# Patient Record
Sex: Male | Born: 1970 | Race: White | Hispanic: No | Marital: Married | Smoking: Former smoker
Health system: Southern US, Community
[De-identification: ages and names within clinical notes are randomized; demographics above are authoritative.]

## PROBLEM LIST (undated history)

## (undated) ENCOUNTER — Ambulatory Visit: Admission: EM | Payer: BC Managed Care – PPO | Source: Home / Self Care

## (undated) DIAGNOSIS — E079 Disorder of thyroid, unspecified: Secondary | ICD-10-CM

## (undated) DIAGNOSIS — F99 Mental disorder, not otherwise specified: Secondary | ICD-10-CM

## (undated) DIAGNOSIS — I10 Essential (primary) hypertension: Secondary | ICD-10-CM

---

## 2005-10-02 ENCOUNTER — Emergency Department: Payer: Self-pay | Admitting: Emergency Medicine

## 2005-10-02 ENCOUNTER — Other Ambulatory Visit: Payer: Self-pay

## 2008-03-21 ENCOUNTER — Emergency Department: Payer: Self-pay | Admitting: Unknown Physician Specialty

## 2008-06-13 ENCOUNTER — Emergency Department: Payer: Self-pay | Admitting: Emergency Medicine

## 2010-08-25 ENCOUNTER — Emergency Department: Payer: Self-pay | Admitting: Emergency Medicine

## 2011-02-15 ENCOUNTER — Emergency Department: Payer: Self-pay | Admitting: Emergency Medicine

## 2011-08-17 ENCOUNTER — Emergency Department (HOSPITAL_COMMUNITY)
Admission: EM | Admit: 2011-08-17 | Discharge: 2011-08-17 | Disposition: A | Discharge status: Home / Self Care | Payer: Self-pay | Attending: Emergency Medicine | Admitting: Emergency Medicine

## 2011-08-17 ENCOUNTER — Encounter (HOSPITAL_COMMUNITY): Payer: Self-pay | Admitting: *Deleted

## 2011-08-17 ENCOUNTER — Emergency Department (HOSPITAL_COMMUNITY): Payer: Self-pay

## 2011-08-17 DIAGNOSIS — W2209XA Striking against other stationary object, initial encounter: Secondary | ICD-10-CM | POA: Insufficient documentation

## 2011-08-17 DIAGNOSIS — S0990XA Unspecified injury of head, initial encounter: Secondary | ICD-10-CM | POA: Insufficient documentation

## 2011-08-17 DIAGNOSIS — S0100XA Unspecified open wound of scalp, initial encounter: Secondary | ICD-10-CM | POA: Insufficient documentation

## 2011-08-17 DIAGNOSIS — I10 Essential (primary) hypertension: Secondary | ICD-10-CM | POA: Insufficient documentation

## 2011-08-17 DIAGNOSIS — S0101XA Laceration without foreign body of scalp, initial encounter: Secondary | ICD-10-CM

## 2011-08-17 DIAGNOSIS — IMO0002 Reserved for concepts with insufficient information to code with codable children: Secondary | ICD-10-CM | POA: Insufficient documentation

## 2011-08-17 HISTORY — DX: Essential (primary) hypertension: I10

## 2011-08-17 HISTORY — DX: Disorder of thyroid, unspecified: E07.9

## 2011-08-17 HISTORY — DX: Mental disorder, not otherwise specified: F99

## 2011-08-17 NOTE — Discharge Instructions (Signed)
Please read over the instructions below. You will either need to return to your primary care physician or to the [facility] in 7 days to have the staples removed. Return for any concerning symptoms otherwise follow up as discussed.  Head Injury, Adult You have had a head injury that does not appear serious at this time. A concussion is a state of changed mental ability, usually from a blow to the head. You should take clear liquids for the rest of the day and then resume your regular diet. You should not take sedatives or alcoholic beverages for as long as directed by your caregiver after discharge. After injuries such as yours, most problems occur within the first 24 hours. SYMPTOMS These minor symptoms may be experienced after discharge:  Memory difficulties.   Dizziness.   Headaches.   Double vision.   Hearing difficulties.   Depression.   Tiredness.   Weakness.   Difficulty with concentration.  If you experience any of these problems, you should not be alarmed. A concussion requires a few days for recovery. Many patients with head injuries frequently experience such symptoms. Usually, these problems disappear without medical care. If symptoms last for more than one day, notify your caregiver. See your caregiver sooner if symptoms are becoming worse rather than better. HOME CARE INSTRUCTIONS   During the next 24 hours you must stay with someone who can watch you for the warning signs listed below.  Although it is unlikely that serious side effects will occur, you should be aware of signs and symptoms which may necessitate your return to this location. Side effects may occur up to 7 - 10 days following the injury. It is important for you to carefully monitor your condition and contact your caregiver or seek immediate medical attention if there is a change in your condition. SEEK IMMEDIATE MEDICAL CARE IF:   There is confusion or drowsiness.   You can not awaken the  injured person.   There is nausea (feeling sick to your stomach) or continued, forceful vomiting.   You notice dizziness or unsteadiness which is getting worse, or inability to walk.   You have convulsions or unconsciousness.   You experience severe, persistent headaches not relieved by over-the-counter or prescription medicines for pain. (Do not take aspirin as this impairs clotting abilities). Take other pain medications only as directed.   You can not use arms or legs normally.   There is clear or bloody discharge from the nose or ears.  MAKE SURE YOU:   Understand these instructions.   Will watch your condition.   Will get help right away if you are not doing well or get worse.  Document Released: 05/26/2005 Document Revised: 05/15/2011 Document Reviewed: 04/13/2009 [facility] Patient Information 2012 [place], [place].Laceration Care, Adult A laceration is a cut or lesion that goes through all layers of the skin and into the tissue just beneath the skin. TREATMENT  Some lacerations may not require closure. Some lacerations may not be able to be closed due to an increased risk of infection. It is important to see your caregiver as soon as possible after an injury to minimize the risk of infection and maximize the opportunity for successful closure. If closure is appropriate, pain medicines may be given, if needed. The wound will be cleaned to help prevent infection. Your caregiver will use stitches (sutures), staples, wound glue (adhesive), or skin adhesive strips to repair the laceration. These tools bring the skin edges together to allow for  faster healing and a better cosmetic outcome. However, all wounds will heal with a scar. Once the wound has healed, scarring can be minimized by covering the wound with sunscreen during the day for 1 full year. HOME CARE INSTRUCTIONS  For sutures or staples:  Keep the wound clean and dry.   If you were given a bandage (dressing), you should  change it at least once a day. Also, change the dressing if it becomes wet or dirty, or as directed by your caregiver.   Wash the wound with soap and water 2 times a day. Rinse the wound off with water to remove all soap. Pat the wound dry with a clean towel.   After cleaning, apply a thin layer of the antibiotic ointment as recommended by your caregiver. This will help prevent infection and keep the dressing from sticking.   You may shower as usual after the first 24 hours. Do not soak the wound in water until the sutures are removed.   Only take over-the-counter or prescription medicines for pain, discomfort, or fever as directed by your caregiver.   Get your sutures or staples removed as directed by your caregiver.  For skin adhesive strips:  Keep the wound clean and dry.   Do not get the skin adhesive strips wet. You may bathe carefully, using caution to keep the wound dry.   If the wound gets wet, pat it dry with a clean towel.   Skin adhesive strips will fall off on their own. You may trim the strips as the wound heals. Do not remove skin adhesive strips that are still stuck to the wound. They will fall off in time.  For wound adhesive:  You may briefly wet your wound in the shower or bath. Do not soak or scrub the wound. Do not swim. Avoid periods of heavy perspiration until the skin adhesive has fallen off on its own. After showering or bathing, gently pat the wound dry with a clean towel.   Do not apply liquid medicine, cream medicine, or ointment medicine to your wound while the skin adhesive is in place. This may loosen the film before your wound is healed.   If a dressing is placed over the wound, be careful not to apply tape directly over the skin adhesive. This may cause the adhesive to be pulled off before the wound is healed.   Avoid prolonged exposure to sunlight or tanning lamps while the skin adhesive is in place. Exposure to ultraviolet light in the first year will  darken the scar.   The skin adhesive will usually remain in place for 5 to 10 days, then naturally fall off the skin. Do not pick at the adhesive film.  You may need a tetanus shot if:  You cannot remember when you had your last tetanus shot.   You have never had a tetanus shot.  If you get a tetanus shot, your arm may swell, get red, and feel warm to the touch. This is common and not a problem. If you need a tetanus shot and you choose not to have one, there is a rare chance of getting tetanus. Sickness from tetanus can be serious. SEEK MEDICAL CARE IF:   You have redness, swelling, or increasing pain in the wound.   You see a red line that goes away from the wound.   You have yellowish-white fluid (pus) coming from the wound.   You have a fever.   You notice a bad smell  coming from the wound or dressing.   Your wound breaks open before or after sutures have been removed.   You notice something coming out of the wound such as wood or glass.   Your wound is on your hand or foot and you cannot move a finger or toe.  SEEK IMMEDIATE MEDICAL CARE IF:   Your pain is not controlled with prescribed medicine.   You have severe swelling around the wound causing pain and numbness or a change in color in your arm, hand, leg, or foot.   Your wound splits open and starts bleeding.   You have worsening numbness, weakness, or loss of function of any joint around or beyond the wound.   You develop painful lumps near the wound or on the skin anywhere on your body.  MAKE SURE YOU:   Understand these instructions.   Will watch your condition.   Will get help right away if you are not doing well or get worse.  Document Released: 05/26/2005 Document Revised: 05/15/2011 Document Reviewed: 11/19/2010 [facility] Patient Information 2012 [place], [place].Stitches, Staples, or Skin Adhesive Strips  Stitches (sutures), staples, and skin adhesive strips hold the skin together as it heals. They will  usually be in place for 7 days or less. HOME CARE  Wash your hands with soap and water before and after you touch your wound.   Only take medicine as told by your doctor.   Cover your wound only if your doctor told you to. Otherwise, leave it open to air.   Do not get your stitches wet or dirty. If they get dirty, dab them gently with a clean washcloth. Wet the washcloth with soapy water. Do not rub. Pat them dry gently.   Do not put medicine or medicated cream on your stitches unless your doctor told you to.   Do not take out your own stitches or staples. Skin adhesive strips will fall off by themselves.   Do not pick at the wound. Picking can cause an infection.   Do not miss your follow-up appointment.   If you have problems or questions, call your doctor.  GET HELP RIGHT AWAY IF:   You have a temperature by mouth above 102 F (38.9 C), not controlled by medicine.   You have chills.   You have redness or pain around your stitches.   There is puffiness (swelling) around your stitches.   You notice fluid (drainage) from your stitches.   There is a bad smell coming from your wound.  MAKE SURE YOU:  Understand these instructions.   Will watch your condition.   Will get help if you are not doing well or get worse.  Document Released: 03/23/2009 Document Revised: 05/15/2011 Document Reviewed: 03/23/2009 [facility] Patient Information 2012 [place], [place].
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
## 2011-12-21 ENCOUNTER — Emergency Department: Payer: Self-pay | Admitting: Emergency Medicine

## 2014-07-02 ENCOUNTER — Emergency Department: Payer: Self-pay | Admitting: Internal Medicine

## 2016-06-23 ENCOUNTER — Encounter: Payer: Self-pay | Admitting: *Deleted

## 2016-06-23 ENCOUNTER — Emergency Department
Admission: EM | Admit: 2016-06-23 | Discharge: 2016-06-23 | Disposition: A | Discharge status: Home / Self Care | Payer: BLUE CROSS/BLUE SHIELD | Attending: Emergency Medicine | Admitting: Emergency Medicine

## 2016-06-23 DIAGNOSIS — Z87891 Personal history of nicotine dependence: Secondary | ICD-10-CM | POA: Insufficient documentation

## 2016-06-23 DIAGNOSIS — I10 Essential (primary) hypertension: Secondary | ICD-10-CM | POA: Diagnosis not present

## 2016-06-23 DIAGNOSIS — Z760 Encounter for issue of repeat prescription: Secondary | ICD-10-CM | POA: Diagnosis present

## 2016-06-23 DIAGNOSIS — E039 Hypothyroidism, unspecified: Secondary | ICD-10-CM

## 2016-06-23 LAB — COMPREHENSIVE METABOLIC PANEL
ALK PHOS: 35 U/L — AB (ref 38–126)
ALT: 21 U/L (ref 17–63)
ANION GAP: 6 (ref 5–15)
AST: 25 U/L (ref 15–41)
Albumin: 4.4 g/dL (ref 3.5–5.0)
BUN: 15 mg/dL (ref 6–20)
CALCIUM: 9 mg/dL (ref 8.9–10.3)
CHLORIDE: 106 mmol/L (ref 101–111)
CO2: 27 mmol/L (ref 22–32)
CREATININE: 1.27 mg/dL — AB (ref 0.61–1.24)
GFR calc Af Amer: >60 mL/min (ref >60)
Glucose, Bld: 87 mg/dL (ref 65–99)
Potassium: 4.1 mmol/L (ref 3.5–5.1)
Sodium: 139 mmol/L (ref 135–145)
Total Bilirubin: 0.6 mg/dL (ref 0.3–1.2)
Total Protein: 7.1 g/dL (ref 6.5–8.1)

## 2016-06-23 LAB — CBC
HCT: 42.8 % (ref 40.0–52.0)
Hemoglobin: 14.8 g/dL (ref 13.0–18.0)
MCH: 31.5 pg (ref 26.0–34.0)
MCHC: 34.5 g/dL (ref 32.0–36.0)
MCV: 91.3 fL (ref 80.0–100.0)
PLATELETS: 135 10*3/uL — AB (ref 150–440)
RBC: 4.68 MIL/uL (ref 4.40–5.90)
RDW: 14.5 % (ref 11.5–14.5)
WBC: 6.4 10*3/uL (ref 3.8–10.6)

## 2016-06-23 LAB — TSH: TSH: 274.809 u[IU]/mL — ABNORMAL HIGH (ref 0.350–4.500)

## 2016-06-23 MED ORDER — LEVOTHYROXINE SODIUM 175 MCG PO TABS: 175.0000 ug | ORAL_TABLET | Freq: Every day | ORAL | 0 refills | Status: DC

## 2016-06-23 MED ORDER — HYDROCHLOROTHIAZIDE 25 MG PO TABS: 25.0000 mg | ORAL_TABLET | Freq: Every day | ORAL | 0 refills | Status: DC

## 2016-06-23 NOTE — ED Notes (Signed)
AAOx3.  Skin warm and dry. NAD.  Ambulates with easy and steady gait.   

## 2016-06-23 NOTE — ED Triage Notes (Addendum)
States he has been out of his thyroid medication for 1 month and is unable to get to his PCP, states he was on BP meds at one point but is not on any currently

## 2016-06-23 NOTE — ED Notes (Signed)
See triage note   States he needs his thyroid meds refilled  Not able to see PCP

## 2016-06-23 NOTE — ED Provider Notes (Signed)
[facility] Emergency Department Provider Note  ____________________________________________  Time seen: Approximately 1:29 PM  I have reviewed the triage vital signs and the nursing notes.   HISTORY  Chief Complaint Medication Refill    HPI Chris Larsen is a 46 y.o. male that presents to the emergency department because he is out of levothyroxine. He states he last refilled his medicine in November. Patient states that he has taken 175 mcg of levothyroxine since he was 15. Patient also states that he was on blood pressure medication one year ago and was taken off the medication. Patient states he has continued to have high blood pressure since then.Patient does not remember which blood pressure medication he was on. Patient states that since being out of levothyroxine, he has had a headache, swelling, weight gain, sweating, nausea, diarrhea, constipation. Patient has not been able to get to his PCP, as he does not like driving to [facility]. Patient would like to find a PCP in the area.   Past Medical History:  Diagnosis Date  . Chronic mental illness    Unable to specifiy  . Hypertension   . Thyroid disease     There are no active problems to display for this patient.   History reviewed. No pertinent surgical history.  Prior to Admission medications   Medication Sig Start Date End Date Taking? Authorizing Provider  hydrochlorothiazide (HYDRODIURIL) 25 MG tablet Take 1 tablet (25 mg total) by mouth daily. 06/23/16   Enid Derry, PA-C  levothyroxine (SYNTHROID, LEVOTHROID) 175 MCG tablet Take 1 tablet (175 mcg total) by mouth daily. 06/23/16   Enid Derry, PA-C    Allergies Patient has no known allergies.  History reviewed. No pertinent family history.  Social History Social History  Substance Use Topics  . Smoking status: Former Smoker    Quit date: 06/19/2011  . Smokeless tobacco: Not on file  . Alcohol use No     Review of Systems   Constitutional: No fever/chills Cardiovascular: No chest pain. Respiratory: No cough. Gastrointestinal: No vomiting.  Skin: Negative for rash, abrasions, lacerations, ecchymosis. Neurological: Negative for numbness or tingling   ____________________________________________   PHYSICAL EXAM:  VITAL SIGNS: ED Triage Vitals [06/23/16 1145]  Enc Vitals Group     BP (!) 162/109     Pulse Rate 84     Resp 20     Temp 98.6 F (37 C)     Temp Source Oral     SpO2 99 %     Weight 180 lb 9.6 oz (81.9 kg)     Height 5\' 5"  (1.651 m)     Head Circumference      Peak Flow      Pain Score 8     Pain Loc      Pain Edu?      Excl. in GC?      Constitutional: Alert and oriented. Well appearing and in no acute distress. Eyes: Conjunctivae are normal. PERRL. EOMI. Head: Atraumatic. ENT:      Ears:      Nose: No congestion/rhinnorhea.      Mouth/Throat: Mucous membranes are moist.  Neck: No stridor.   Cardiovascular: Normal rate, regular rhythm. Normal S1 and S2.  Good peripheral circulation. Respiratory: Normal respiratory effort without tachypnea or retractions. Lungs CTAB. Good air entry to the bases with no decreased or absent breath sounds. Gastrointestinal: Bowel sounds 4 quadrants. Soft and nontender to palpation. No guarding or rigidity. No palpable masses. No distention.  Musculoskeletal: Full range of motion to all extremities. No gross deformities appreciated. Neurologic:  Normal speech and language. No gross focal neurologic deficits are appreciated.  Skin:  Skin is warm, dry and intact. No rash noted. Psychiatric: Mood and affect are normal. Speech and behavior are normal. Patient exhibits appropriate insight and judgement.   ____________________________________________   LABS (all labs ordered are listed, but only abnormal results are displayed)  Labs Reviewed  CBC - Abnormal; Notable for the following:       Result Value   Platelets 135 (*)    All other  components within normal limits  COMPREHENSIVE METABOLIC PANEL - Abnormal; Notable for the following:    Creatinine, Ser 1.27 (*)    Alkaline Phosphatase 35 (*)    All other components within normal limits  TSH - Abnormal; Notable for the following:    TSH 274.809 (*)    All other components within normal limits   ____________________________________________  EKG   ____________________________________________  RADIOLOGY   No results found.  ____________________________________________    PROCEDURES  Procedure(s) performed:    Procedures    Medications - No data to display   ____________________________________________   INITIAL IMPRESSION / ASSESSMENT AND PLAN / ED COURSE  Pertinent labs & imaging results that were available during my care of the patient were reviewed by me and considered in my medical decision making (see chart for details).  Review of the [place] CSRS was performed in accordance of the [facility] prior to dispensing any controlled drugs.  Clinical Course as of Jun 23 1526  Mon Jun 23, 2016  1350 TSH: Marland Kitchen(!) 274.809 [AW]    Clinical Course User Index [AW] Enid DerryAshley Mykelti Goldenstein, PA-C    Patient's diagnosis is consistent with hypothyroidism and hypertension. Patient is symptomatic so a short course of levothyroxine and hydrochlorothiazide will be given. Patient was instructed [facility] [place] [facility] [facility] [place]. Vital signs and exam are reassuring. Patient will be discharged home with prescriptions for levothyroxine and hydrochlorothiazide. Patient is given ED precautions to return to the ED for any worsening or new symptoms.     ____________________________________________  FINAL CLINICAL IMPRESSION(S) / ED DIAGNOSES  Final diagnoses:  Medication refill  Hypertension, unspecified type  Hypothyroidism, unspecified type      NEW MEDICATIONS STARTED DURING THIS VISIT:  Discharge Medication List as of  06/23/2016  2:08 PM    START taking these medications   Details  hydrochlorothiazide (HYDRODIURIL) 25 MG tablet Take 1 tablet (25 mg total) by mouth daily., Starting Mon 06/23/2016, Print            This chart was dictated using voice recognition software/Dragon. Despite best efforts to proofread, errors can occur which can change the meaning. Any change was purely unintentional.    Enid DerryAshley Chrisean Kloth, PA-C 06/23/16 1529    Governor Rooksebecca Lord, MD 06/23/16 [phone]

## 2017-11-25 ENCOUNTER — Ambulatory Visit
Admission: RE | Admit: 2017-11-25 | Discharge: 2017-11-25 | Disposition: A | Discharge status: Home / Self Care | Payer: Worker's Compensation | Source: Ambulatory Visit | Attending: Physician Assistant | Admitting: Physician Assistant

## 2017-11-25 ENCOUNTER — Other Ambulatory Visit: Payer: Self-pay | Admitting: Physician Assistant

## 2017-11-25 DIAGNOSIS — M25512 Pain in left shoulder: Secondary | ICD-10-CM | POA: Diagnosis present

## 2017-12-04 ENCOUNTER — Emergency Department
Admission: EM | Admit: 2017-12-04 | Discharge: 2017-12-04 | Disposition: A | Discharge status: Home / Self Care | Payer: BLUE CROSS/BLUE SHIELD | Attending: Emergency Medicine | Admitting: Emergency Medicine

## 2017-12-04 ENCOUNTER — Other Ambulatory Visit: Payer: Self-pay

## 2017-12-04 ENCOUNTER — Emergency Department
Admission: EM | Admit: 2017-12-04 | Discharge: 2017-12-04 | Disposition: A | Discharge status: Home / Self Care | Payer: BLUE CROSS/BLUE SHIELD | Source: Home / Self Care | Attending: Emergency Medicine | Admitting: Emergency Medicine

## 2017-12-04 ENCOUNTER — Encounter: Payer: Self-pay | Admitting: *Deleted

## 2017-12-04 ENCOUNTER — Encounter: Payer: Self-pay | Admitting: Emergency Medicine

## 2017-12-04 DIAGNOSIS — E039 Hypothyroidism, unspecified: Secondary | ICD-10-CM | POA: Insufficient documentation

## 2017-12-04 DIAGNOSIS — Z760 Encounter for issue of repeat prescription: Secondary | ICD-10-CM

## 2017-12-04 DIAGNOSIS — Z87891 Personal history of nicotine dependence: Secondary | ICD-10-CM | POA: Insufficient documentation

## 2017-12-04 DIAGNOSIS — I10 Essential (primary) hypertension: Secondary | ICD-10-CM

## 2017-12-04 LAB — CBC WITH DIFFERENTIAL/PLATELET
BASOS ABS: 0.1 10*3/uL (ref 0–0.1)
Basophils Relative: 1 %
EOS ABS: 0.2 10*3/uL (ref 0–0.7)
Eosinophils Relative: 3 %
HEMATOCRIT: 44.6 % (ref 40.0–52.0)
Hemoglobin: 15.6 g/dL (ref 13.0–18.0)
Lymphocytes Relative: 28 %
Lymphs Abs: 2.5 10*3/uL (ref 1.0–3.6)
MCH: 31.6 pg (ref 26.0–34.0)
MCHC: 35 g/dL (ref 32.0–36.0)
MCV: 90.5 fL (ref 80.0–100.0)
MONO ABS: 0.5 10*3/uL (ref 0.2–1.0)
Monocytes Relative: 5 %
NEUTROS ABS: 5.8 10*3/uL (ref 1.4–6.5)
Neutrophils Relative %: 63 %
PLATELETS: 184 10*3/uL (ref 150–440)
RBC: 4.93 MIL/uL (ref 4.40–5.90)
RDW: 13.9 % (ref 11.5–14.5)
WBC: 9.1 10*3/uL (ref 3.8–10.6)

## 2017-12-04 LAB — COMPREHENSIVE METABOLIC PANEL
ALBUMIN: 4.1 g/dL (ref 3.5–5.0)
ALT: 32 U/L (ref 0–44)
AST: 26 U/L (ref 15–41)
Alkaline Phosphatase: 40 U/L (ref 38–126)
Anion gap: 9 (ref 5–15)
BUN: 20 mg/dL (ref 6–20)
CHLORIDE: 103 mmol/L (ref 98–111)
CO2: 24 mmol/L (ref 22–32)
CREATININE: 1.35 mg/dL — AB (ref 0.61–1.24)
Calcium: 8.9 mg/dL (ref 8.9–10.3)
GFR calc Af Amer: >60 mL/min (ref >60)
GLUCOSE: 117 mg/dL — AB (ref 70–99)
Potassium: 3.6 mmol/L (ref 3.5–5.1)
Sodium: 136 mmol/L (ref 135–145)
Total Bilirubin: 0.4 mg/dL (ref 0.3–1.2)
Total Protein: 6.8 g/dL (ref 6.5–8.1)

## 2017-12-04 LAB — TSH: TSH: 374.399 u[IU]/mL — AB (ref 0.350–4.500)

## 2017-12-04 MED ORDER — HYDROCHLOROTHIAZIDE 25 MG PO TABS: 25.0000 mg | ORAL_TABLET | Freq: Every day | ORAL | 0 refills | Status: AC

## 2017-12-04 MED ORDER — KETOROLAC TROMETHAMINE 30 MG/ML IJ SOLN: 30.0000 mg | Freq: Once | INTRAMUSCULAR | Status: DC

## 2017-12-04 MED ORDER — LEVOTHYROXINE SODIUM 175 MCG PO TABS: 175.0000 ug | ORAL_TABLET | Freq: Every day | ORAL | 0 refills | Status: AC

## 2017-12-04 NOTE — ED Provider Notes (Signed)
[facility] Emergency Department Provider Note   ____________________________________________   First MD Initiated Contact with Patient 12/04/17 1412     (approximate)  I have reviewed the triage vital signs and the nursing notes.   HISTORY  Chief Complaint Medication Refill    HPI Chris Larsen is a 47 y.o. male patient requests refill of medication for hypothyroidism and hypertension.  Patient state he was seen by family doctor 8 months ago and given 29-month supply of levothyroxine.  Patient state he has been out of medication for 1 1/2 to 2 months.  Patient stated he requesting enough medication to see another family practitioner.   Past Medical History:  Diagnosis Date  . Chronic mental illness    Unable to specifiy  . Hypertension   . Thyroid disease     There are no active problems to display for this patient.   History reviewed. No pertinent surgical history.  Prior to Admission medications   Medication Sig Start Date End Date Taking? Authorizing Provider  hydrochlorothiazide (HYDRODIURIL) 25 MG tablet Take 1 tablet (25 mg total) by mouth daily. 12/04/17   Joni Reining, PA-C  levothyroxine (SYNTHROID, LEVOTHROID) 175 MCG tablet Take 1 tablet (175 mcg total) by mouth daily. 12/04/17   Joni Reining, PA-C    Allergies Patient has no known allergies.  No family history on file.  Social History Social History   Tobacco Use  . Smoking status: Former Smoker    Last attempt to quit: 06/19/2011    Years since quitting: 6.4  . Smokeless tobacco: Never Used  Substance Use Topics  . Alcohol use: No  . Drug use: No    Review of Systems Constitutional: No fever/chills Eyes: No visual changes. ENT: No sore throat. Cardiovascular: Denies chest pain. Respiratory: Denies shortness of breath. Gastrointestinal: No abdominal pain.  No nausea, no vomiting.  No diarrhea.  No constipation. Genitourinary: Negative for  dysuria. Musculoskeletal: Negative for back pain. Skin: Negative for rash. Neurological: Negative for headaches, focal weakness or numbness. Endocrine:Hypertension hypothyroidism.  ____________________________________________   PHYSICAL EXAM:  VITAL SIGNS: ED Triage Vitals  Enc Vitals Group     BP 12/04/17 1409 (!) 147/103     Pulse Rate 12/04/17 1409 88     Resp 12/04/17 1409 16     Temp 12/04/17 1409 98.8 F (37.1 C)     Temp Source 12/04/17 1409 Oral     SpO2 12/04/17 1409 96 %     Weight 12/04/17 1410 170 lb (77.1 kg)     Height 12/04/17 1410 5\' 6"  (1.676 m)     Head Circumference --      Peak Flow --      Pain Score 12/04/17 1413 6     Pain Loc --      Pain Edu? --      Excl. in GC? --    Constitutional: Alert and oriented. Well appearing and in no acute distress. Cardiovascular: Normal rate, regular rhythm. Grossly normal heart sounds.  Good peripheral circulation. Respiratory: Normal respiratory effort.  No retractions. Lungs CTAB. Musculoskeletal: No lower extremity tenderness nor edema.  No joint effusions. Neurologic:  Normal speech and language. No gross focal neurologic deficits are appreciated. No gait instability. Skin:  Skin is warm, dry and intact. No rash noted. Psychiatric: Mood and affect are normal. Speech and behavior are normal.  ____________________________________________   LABS (all labs ordered are listed, but only abnormal results are displayed)  Labs Reviewed -  No data to display ____________________________________________  EKG   ____________________________________________  RADIOLOGY  ED MD interpretation:    Official radiology report(s): No results found.  ____________________________________________   PROCEDURES  Procedure(s) performed: None  Procedures  Critical Care performed: No  ____________________________________________   INITIAL IMPRESSION / ASSESSMENT AND PLAN / ED COURSE  As part of my medical decision  making, I reviewed the following data within the electronic MEDICAL RECORD NUMBER    Patient presents for refill of medication for hypertension hypothyroidism.  Patient stable to establish PCP within the next month.  Discussed lab results with patient.  Patient given prescription for levothyroxine and HCTZ.      ____________________________________________   FINAL CLINICAL IMPRESSION(S) / ED DIAGNOSES  Final diagnoses:  None     ED Discharge Orders    None       Note:  This document was prepared using Dragon voice recognition software and may include unintentional dictation errors.    Joni ReiningSmith, Ronald K, PA-C 12/04/17 1551    Sharyn CreamerQuale, Mark, MD 12/04/17 1704    Joni ReiningSmith, Ronald K, PA-C 12/04/17 [id number] [id number]    Quale, Mark, MD 12/06/17 1459

## 2017-12-04 NOTE — Discharge Instructions (Addendum)
Advised to establish care with PCP for refills of medications.

## 2017-12-04 NOTE — ED Notes (Signed)
Pt requesting medication refill. Hypertension is reported to be due to the pain in his arm. Pt in NAD at this time.

## 2017-12-04 NOTE — ED Triage Notes (Addendum)
Pt arrives via POV with requests for levothyroxine 175mcg refill. Pt reports taking the medication since he was 47 years old. Got a 6 month supply when he went to his doctor approx 8 months ago. Has been out 1.5-2 months. Requesting "enough to get by" until he can get to his doctor. States he cannot get appt "for at least a month."  BP elevated d/t pain in left shoulder from work incident. Hx of hypertension. Not here for shoulder. Has appt with orthopedic today.

## 2017-12-04 NOTE — ED Notes (Signed)
Pt left room AMA right as Provider reported treatment plan. Pt did not stop for signature of leaving AMA and did not stop for registration. Pt ambulatory at time of discharge and in NAD.

## 2017-12-04 NOTE — ED Provider Notes (Signed)
[facility] Emergency Department Provider Note   ____________________________________________   First MD Initiated Contact with Patient 12/04/17 1412     (approximate)  I have reviewed the triage vital signs and the nursing notes.   HISTORY  Chief Complaint Medication Refill    HPI Chris Larsen is a 47 y.o. male patient requests refill of medication for hypothyroidism and hypertension.  Patient state he was seen by family doctor 8 months ago and given 6767-month supply of levothyroxine.  Patient state he has been out of medication for 1 1/2 to 2 months.  Patient stated he requesting enough medication to see another family practitioner.   Past Medical History:  Diagnosis Date  . Chronic mental illness    Unable to specifiy  . Hypertension   . Thyroid disease     There are no active problems to display for this patient.   History reviewed. No pertinent surgical history.  Prior to Admission medications   Medication Sig Start Date End Date Taking? Authorizing Provider  hydrochlorothiazide (HYDRODIURIL) 25 MG tablet Take 1 tablet (25 mg total) by mouth daily. 12/04/17   Joni ReiningSmith, Peggyann Zwiefelhofer K, PA-C  levothyroxine (SYNTHROID, LEVOTHROID) 175 MCG tablet Take 1 tablet (175 mcg total) by mouth daily. 12/04/17   Joni ReiningSmith, Teneil Shiller K, PA-C    Allergies Patient has no known allergies.  History reviewed. No pertinent family history.  Social History Social History   Tobacco Use  . Smoking status: Former Smoker    Last attempt to quit: 06/19/2011    Years since quitting: 6.4  . Smokeless tobacco: Never Used  Substance Use Topics  . Alcohol use: No  . Drug use: No    Review of Systems Constitutional: No fever/chills Eyes: No visual changes. ENT: No sore throat. Cardiovascular: Denies chest pain. Respiratory: Denies shortness of breath. Gastrointestinal: No abdominal pain.  No nausea, no vomiting.  No diarrhea.  No constipation. Genitourinary: Negative for  dysuria. Musculoskeletal: Negative for back pain. Skin: Negative for rash. Neurological: Negative for headaches, focal weakness or numbness. Endocrine:Hypertension hypothyroidism.  ____________________________________________   PHYSICAL EXAM:  VITAL SIGNS: ED Triage Vitals  Enc Vitals Group     BP 12/04/17 1409 (!) 147/103     Pulse Rate 12/04/17 1409 88     Resp 12/04/17 1409 16     Temp 12/04/17 1409 98.8 F (37.1 C)     Temp Source 12/04/17 1409 Oral     SpO2 12/04/17 1409 96 %     Weight 12/04/17 1410 170 lb (77.1 kg)     Height 12/04/17 1410 5\' 6"  (1.676 m)     Head Circumference --      Peak Flow --      Pain Score 12/04/17 1413 6     Pain Loc --      Pain Edu? --      Excl. in GC? --    Constitutional: Alert and oriented. Well appearing and in no acute distress. Cardiovascular: Normal rate, regular rhythm. Grossly normal heart sounds.  Good peripheral circulation. Respiratory: Normal respiratory effort.  No retractions. Lungs CTAB. Musculoskeletal: No lower extremity tenderness nor edema.  No joint effusions. Neurologic:  Normal speech and language. No gross focal neurologic deficits are appreciated. No gait instability. Skin:  Skin is warm, dry and intact. No rash noted. Psychiatric: Mood and affect are normal. Speech and behavior are normal.  ____________________________________________   LABS (all labs ordered are listed, but only abnormal results are displayed)  Labs Reviewed  COMPREHENSIVE METABOLIC PANEL - Abnormal; Notable for the following components:      Result Value   Glucose, Bld 117 (*)    Creatinine, Ser 1.35 (*)    All other components within normal limits  TSH - Abnormal; Notable for the following components:   TSH 374.399 (*)    All other components within normal limits  CBC WITH DIFFERENTIAL/PLATELET   ____________________________________________  EKG   ____________________________________________  RADIOLOGY  ED MD interpretation:     Official radiology report(s): No results found.  ____________________________________________   PROCEDURES  Procedure(s) performed: None  Procedures  Critical Care performed: No  ____________________________________________   INITIAL IMPRESSION / ASSESSMENT AND PLAN / ED COURSE  As part of my medical decision making, I reviewed the following data within the electronic MEDICAL RECORD NUMBER    Patient presents for refill of medication for hypertension hypothyroidism.  Patient stable to establish PCP within the next month.  Discussed lab results with patient.  Patient given prescription for levothyroxine and HCTZ.      ____________________________________________   FINAL CLINICAL IMPRESSION(S) / ED DIAGNOSES  Final diagnoses:  Medication refill     ED Discharge Orders        Ordered    hydrochlorothiazide (HYDRODIURIL) 25 MG tablet  Daily     12/04/17 1546    levothyroxine (SYNTHROID, LEVOTHROID) 175 MCG tablet  Daily     12/04/17 1546       Note:  This document was prepared using Dragon voice recognition software and may include unintentional dictation errors.    Joni Reining, PA-C 12/04/17 1551    Sharyn Creamer, MD 12/04/17 [phone]

## 2017-12-04 NOTE — ED Triage Notes (Signed)
Pt arrives via POV with requests for levothyroxine 175mcg refill. Pt reports taking the medication since he was 47 years old. Got a 6 month supply when he went to his doctor approx 8 months ago. Has been out 1.5-2 months. Requesting "enough to get by" until he can get to his doctor. States he cannot get appt "for at least a month."

## 2017-12-18 ENCOUNTER — Other Ambulatory Visit: Payer: Self-pay

## 2017-12-18 ENCOUNTER — Emergency Department
Admission: EM | Admit: 2017-12-18 | Discharge: 2017-12-18 | Disposition: A | Discharge status: Home / Self Care | Payer: BLUE CROSS/BLUE SHIELD | Attending: Emergency Medicine | Admitting: Emergency Medicine

## 2017-12-18 ENCOUNTER — Encounter: Payer: Self-pay | Admitting: Emergency Medicine

## 2017-12-18 DIAGNOSIS — I10 Essential (primary) hypertension: Secondary | ICD-10-CM | POA: Insufficient documentation

## 2017-12-18 DIAGNOSIS — M25512 Pain in left shoulder: Secondary | ICD-10-CM | POA: Insufficient documentation

## 2017-12-18 DIAGNOSIS — Z87891 Personal history of nicotine dependence: Secondary | ICD-10-CM | POA: Diagnosis not present

## 2017-12-18 DIAGNOSIS — Z79899 Other long term (current) drug therapy: Secondary | ICD-10-CM | POA: Diagnosis not present

## 2017-12-18 MED ORDER — CLONIDINE HCL 0.1 MG PO TABS
0.1000 mg | ORAL_TABLET | Freq: Once | ORAL | Status: AC
  Administered 2017-12-18: 0.1 mg via ORAL
  Filled 2017-12-18: qty 1

## 2017-12-18 MED ORDER — KETOROLAC TROMETHAMINE 30 MG/ML IJ SOLN
30.0000 mg | Freq: Once | INTRAMUSCULAR | Status: AC
  Administered 2017-12-18: 30 mg via INTRAMUSCULAR
  Filled 2017-12-18: qty 1

## 2017-12-18 MED ORDER — HYDROCODONE-ACETAMINOPHEN 5-325 MG PO TABS: 1.0000 | ORAL_TABLET | ORAL | 0 refills | Status: DC | PRN

## 2017-12-18 MED ORDER — DIAZEPAM 5 MG PO TABS
5.0000 mg | ORAL_TABLET | Freq: Once | ORAL | Status: AC
  Administered 2017-12-18: 5 mg via ORAL
  Filled 2017-12-18: qty 1

## 2017-12-18 NOTE — ED Triage Notes (Signed)
C/O pain to left shoulder.  States heard shoulder "pop" on June 17th, and pain persists.  AAOx3.  Skin warm and dry. NAD

## 2017-12-18 NOTE — ED Provider Notes (Addendum)
[facility] Emergency Department Provider Note   ____________________________________________    I have reviewed the triage vital signs and the nursing notes.   HISTORY  Chief Complaint Shoulder Injury     HPI Chris Larsen is a 47 y.o. male who presents with complaints of left-sided shoulder pain.  Patient has had this pain for nearly a month.  He states the pain is primarily in his left superior shoulder but radiates into his left neck and into his left arm as well.  Seen in urgent care several weeks ago for this with normal x-ray, seen by orthopedics with an MRI suspicious for labrum tear.  Orthopedist feels that pain is out of proportion to exam and has referred for eval of complex regional pain syndrome.  Patient is quite frustrated by this.  Patient reports he did not take his blood pressure medication this morning   Past Medical History:  Diagnosis Date  . Chronic mental illness    Unable to specifiy  . Hypertension   . Thyroid disease     There are no active problems to display for this patient.   History reviewed. No pertinent surgical history.  Prior to Admission medications   Medication Sig Start Date End Date Taking? Authorizing Provider  hydrochlorothiazide (HYDRODIURIL) 25 MG tablet Take 1 tablet (25 mg total) by mouth daily. 12/04/17   Joni Reining, PA-C  HYDROcodone-acetaminophen (NORCO/VICODIN) 5-325 MG tablet Take 1 tablet by mouth every 4 (four) hours as needed for moderate pain. 12/18/17   Jene Every, MD  levothyroxine (SYNTHROID, LEVOTHROID) 175 MCG tablet Take 1 tablet (175 mcg total) by mouth daily. 12/04/17   Joni Reining, PA-C     Allergies Patient has no known allergies.  No family history on file.  Social History Social History   Tobacco Use  . Smoking status: Former Smoker    Last attempt to quit: 06/19/2011    Years since quitting: 6.5  . Smokeless tobacco: Never Used  Substance Use Topics  . Alcohol  use: No  . Drug use: No    Review of Systems  Constitutional: No fever/chills Eyes: No visual changes.   Cardiovascular: Denies chest pain. Respiratory: Denies shortness of breath.  Musculoskeletal: Negative for back pain.  Left shoulder pain as above, severe Skin: Negative for rash. Neurological: Negative for  weakness   ____________________________________________   PHYSICAL EXAM:  VITAL SIGNS: ED Triage Vitals  Enc Vitals Group     BP 12/18/17 0816 (!) 183/129     Pulse Rate 12/18/17 0814 86     Resp 12/18/17 0814 16     Temp 12/18/17 0814 98.6 F (37 C)     Temp Source 12/18/17 0814 Oral     SpO2 12/18/17 0814 97 %     Weight 12/18/17 0813 77.1 kg (170 lb)     Height 12/18/17 0813 1.676 m (5\' 6" )     Head Circumference --      Peak Flow --      Pain Score 12/18/17 0812 8     Pain Loc --      Pain Edu? --      Excl. in GC? --     Constitutional: Alert and oriented. No acute distress.  Anxious  Head: Atraumatic.  Mouth/Throat: Mucous membranes are moist.  Normal exam Neck:  Painless ROM, no vertebral tenderness to palpation Cardiovascular: Normal rate, regular rhythm.   Good peripheral circulation. Respiratory: Normal respiratory effort.  No retractions.  Musculoskeletal: Patient's primary site of tenderness is at the left [facility] joint he is able to range his arm without significant difficulty but I agree his pain does seem to be quite severe and out of proportion with imaging results.  2+ distal pulses Neurologic:  Normal speech and language. No gross focal neurologic deficits are appreciated.  Skin:  Skin is warm, dry and intact. No rash noted. Psychiatric: Mood and affect are normal. Speech and behavior are normal.  ____________________________________________   LABS (all labs ordered are listed, but only abnormal results are displayed)  Labs Reviewed - No data to  display ____________________________________________  EKG  None ____________________________________________  RADIOLOGY  None ____________________________________________   PROCEDURES  Procedure(s) performed: No  Procedures   Critical Care performed: No ____________________________________________   INITIAL IMPRESSION / ASSESSMENT AND PLAN / ED COURSE  Pertinent labs & imaging results that were available during my care of the patient were reviewed by me and considered in my medical decision making (see chart for details).  Patient presents with shoulder pain as above, has already seen orthopedist for this, is upset with his visit there would like a second opinion.  I will refer him to a different orthopedic office.  Treated with IM Toradol, 5 mill grams p.o. Valium, clonidine for blood pressure, patient did not take his blood pressure medications today for his chronic hypertension, recommend PCP follow-up for blood pressure recheck 1 week      ____________________________________________   FINAL CLINICAL IMPRESSION(S) / ED DIAGNOSES  Final diagnoses:  Acute pain of left shoulder        Note:  This document was prepared using Dragon voice recognition software and may include unintentional dictation errors.    Jene EveryKinner, Sallee Hogrefe, MD 12/18/17 1034    Jene EveryKinner, Treniece Holsclaw, MD 12/18/17 1034

## 2017-12-18 NOTE — ED Notes (Signed)
Pt c/o pain left neck/shoulder/arm - the pain started June 17th - pt reports that the left shoulder "popped" when he was moving "something" and since then the pain has been present and getting worse

## 2020-06-19 ENCOUNTER — Emergency Department
Admission: EM | Admit: 2020-06-19 | Discharge: 2020-06-19 | Disposition: A | Discharge status: Home / Self Care | Payer: BC Managed Care – PPO | Attending: Emergency Medicine | Admitting: Emergency Medicine

## 2020-06-19 ENCOUNTER — Encounter: Payer: Self-pay | Admitting: Emergency Medicine

## 2020-06-19 ENCOUNTER — Other Ambulatory Visit: Payer: Self-pay

## 2020-06-19 ENCOUNTER — Emergency Department: Payer: BC Managed Care – PPO

## 2020-06-19 DIAGNOSIS — Y9289 Other specified places as the place of occurrence of the external cause: Secondary | ICD-10-CM | POA: Insufficient documentation

## 2020-06-19 DIAGNOSIS — Z87891 Personal history of nicotine dependence: Secondary | ICD-10-CM | POA: Insufficient documentation

## 2020-06-19 DIAGNOSIS — S4991XA Unspecified injury of right shoulder and upper arm, initial encounter: Secondary | ICD-10-CM | POA: Diagnosis present

## 2020-06-19 DIAGNOSIS — I10 Essential (primary) hypertension: Secondary | ICD-10-CM | POA: Insufficient documentation

## 2020-06-19 DIAGNOSIS — W228XXA Striking against or struck by other objects, initial encounter: Secondary | ICD-10-CM | POA: Diagnosis not present

## 2020-06-19 DIAGNOSIS — Z79899 Other long term (current) drug therapy: Secondary | ICD-10-CM | POA: Insufficient documentation

## 2020-06-19 MED ORDER — OXYCODONE HCL 5 MG PO TABS
5.0000 mg | ORAL_TABLET | Freq: Once | ORAL | Status: AC
  Administered 2020-06-19: 5 mg via ORAL
  Filled 2020-06-19: qty 1

## 2020-06-19 MED ORDER — OXYCODONE HCL 5 MG PO TABS: 5.0000 mg | ORAL_TABLET | Freq: Three times a day (TID) | ORAL | 0 refills | Status: AC | PRN

## 2020-06-19 NOTE — ED Notes (Signed)
Wife at bedside.

## 2020-06-19 NOTE — Discharge Instructions (Addendum)
IMPRESSION: No acute fracture or dislocation of the right shoulder.  X-ray was reassuring.  Take Tylenol and ibuprofen help with your pain.  Take oxycodone for breakthrough pain.  Do not drive or work on this.  Return to the ER for worsening symptoms.  Follow-up with orthopedics if your pain is not resolving in 2 to 3 weeks

## 2020-06-19 NOTE — ED Provider Notes (Signed)
[facility] Emergency Department Provider Note  ____________________________________________   Event Date/Time   First MD Initiated Contact with Patient 06/19/20 2149     (approximate)  I have reviewed the triage vital signs and the nursing notes.   HISTORY  Chief Complaint Shoulder Injury    HPI Chris Larsen is a 50 y.o. male with hypertension who comes in for injury.  Patient states that he was at work when he pipe hit his right shoulder.  He does not want to file Workmen's Comp.  He is having moderate to severe pain constant, worse with moving, nothing makes it better, including ibuprofen and Tylenol.  He denies hitting his head or his chest or any other injuries.          Past Medical History:  Diagnosis Date  . Chronic mental illness    Unable to specifiy  . Hypertension   . Thyroid disease     There are no problems to display for this patient.   History reviewed. No pertinent surgical history.  Prior to Admission medications   Medication Sig Start Date End Date Taking? Authorizing Provider  hydrochlorothiazide (HYDRODIURIL) 25 MG tablet Take 1 tablet (25 mg total) by mouth daily. 12/04/17   Joni Reining, PA-C  HYDROcodone-acetaminophen (NORCO/VICODIN) 5-325 MG tablet Take 1 tablet by mouth every 4 (four) hours as needed for moderate pain. 12/18/17   Jene Every, MD  levothyroxine (SYNTHROID, LEVOTHROID) 175 MCG tablet Take 1 tablet (175 mcg total) by mouth daily. 12/04/17   Joni Reining, PA-C    Allergies Patient has no known allergies.  No family history on file.  Social History Social History   Tobacco Use  . Smoking status: Former Smoker    Quit date: 06/19/2011    Years since quitting: 9.0  . Smokeless tobacco: Never Used  Substance Use Topics  . Alcohol use: No  . Drug use: No      Review of Systems Constitutional: No fever/chills Eyes: No visual changes. ENT: No sore throat. Cardiovascular: Denies chest  pain. Respiratory: Denies shortness of breath. Gastrointestinal: No abdominal pain.  No nausea, no vomiting.  No diarrhea.  No constipation. Genitourinary: Negative for dysuria. Musculoskeletal: Negative for back pain.  Right shoulder injury Skin: Negative for rash. Neurological: Negative for headaches, focal weakness or numbness. All other ROS negative ____________________________________________   PHYSICAL EXAM:  VITAL SIGNS: ED Triage Vitals  Enc Vitals Group     BP 06/19/20 1923 133/78     Pulse Rate 06/19/20 1923 100     Resp 06/19/20 1923 20     Temp 06/19/20 1923 99.8 F (37.7 C)     Temp Source 06/19/20 1923 Oral     SpO2 06/19/20 1923 97 %     Weight 06/19/20 1924 170 lb (77.1 kg)     Height 06/19/20 1924 5\' 6"  (1.676 m)     Head Circumference --      Peak Flow --      Pain Score 06/19/20 1924 6     Pain Loc --      Pain Edu? --      Excl. in GC? --     Constitutional: Alert and oriented. Well appearing and in no acute distress. Eyes: Conjunctivae are normal. EOMI. Head: Atraumatic. Nose: No congestion/rhinnorhea. Mouth/Throat: Mucous membranes are moist.   Neck: No stridor. Trachea Midline. FROM Cardiovascular: Normal rate, regular rhythm. Grossly normal heart sounds.  Good peripheral circulation.  No chest wall tenderness  Respiratory: Normal respiratory effort.  No retractions. Lungs CTAB. Gastrointestinal: Soft and nontender. No distention. No abdominal bruits.  Musculoskeletal: No lower extremity tenderness nor edema.  No joint effusions.  Hematoma noted to the medial aspect of the right upper arm.  Good distal pulse.  Full range of motion of the wrist sensation intact.  Limited range of motion of the shoulder secondary to pain. Neurologic:  Normal speech and language. No gross focal neurologic deficits are appreciated.  Skin:  Skin is warm, dry and intact. No rash noted. Psychiatric: Mood and affect are normal. Speech and behavior are normal. GU: Deferred    ____________________________________________   [facility] RADIOLOGY I, Concha Se, personally viewed and evaluated these images (plain radiographs) as part of my medical decision making, as well as reviewing the written report by the radiologist.  ED MD interpretation: No fracture noted  Official radiology report(s): DG Shoulder Right  Result Date: 06/19/2020 CLINICAL DATA:  Hit in shoulder with pipe EXAM: RIGHT SHOULDER - 2+ VIEW COMPARISON:  None. 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 No fracture or dislocation. IMPRESSION: No acute fracture or dislocation of the right shoulder. Electronically Signed   By: Deatra Robinson M.D.   On: 06/19/2020 20:00    ____________________________________________   PROCEDURES  Procedure(s) performed (including Critical Care):  Procedures   ____________________________________________   INITIAL IMPRESSION / ASSESSMENT AND PLAN / ED COURSE  Chris Larsen was evaluated in Emergency Department on 06/19/2020 for the symptoms described in the history of present illness. He was evaluated in the context of the global COVID-19 pandemic, which necessitated consideration that the patient might be at risk for infection with the SARS-CoV-2 virus that causes COVID-19. Institutional protocols and algorithms that pertain to the evaluation of patients at risk for COVID-19 are in a state of rapid change based on information released by regulatory bodies including the CDC and federal and state organizations. These policies and algorithms were followed during the patient's care in the ED.    Patient is a well-appearing 50 year old who comes in for right shoulder injury. Patient has a hematoma on exam. He is neurovascularly intact. X-ray was ordered evaluate for fracture, dislocation. X-ray did not show any injuries. Suspect this is most likely just a hematoma. Discussed with family that if he continues to have issues down the road he may  need MRI to rule out tendon injury but I suspect this is less likely. Discussed with family he has been taking Tylenol and ibuprofen without much relief in symptoms. We will give a few doses of oxycodone. I reviewed the database he has no recent fills. We discussed no driving or working while on this. Provided a work note for 2 days. Patient will need to follow-up with his primary care doctor for additional days off. This time he does not want to file Workmen's Comp.  To note patient's temperature was slightly elevated in triage. Patient denies any symptoms of fevers or congestion or any other concerns. Declines any work-up at this time stating that he is feels fine discussed with patient that if he develops worsening symptoms he can return to the ER     FINAL CLINICAL IMPRESSION(S) / ED DIAGNOSES   Final diagnoses:  Injury of right shoulder, initial encounter      MEDICATIONS GIVEN DURING THIS VISIT:  Medications  oxyCODONE (Oxy IR/ROXICODONE) immediate release tablet 5 mg (has no administration in time range)     ED Discharge Orders  Ordered    oxyCODONE (ROXICODONE) 5 MG immediate release tablet  Every 8 hours PRN        06/19/20 2202           Note:  This document was prepared using Dragon voice recognition software and may include unintentional dictation errors.   Concha Se, MD 06/19/20 2215
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
## 2020-06-19 NOTE — ED Notes (Signed)
Patient transported to Ultrasound 

## 2020-08-01 ENCOUNTER — Emergency Department: Payer: BC Managed Care – PPO

## 2020-08-01 ENCOUNTER — Other Ambulatory Visit: Payer: Self-pay

## 2020-08-01 ENCOUNTER — Emergency Department
Admission: EM | Admit: 2020-08-01 | Discharge: 2020-08-01 | Disposition: A | Discharge status: Home / Self Care | Payer: BC Managed Care – PPO | Attending: Emergency Medicine | Admitting: Emergency Medicine

## 2020-08-01 DIAGNOSIS — I10 Essential (primary) hypertension: Secondary | ICD-10-CM | POA: Diagnosis not present

## 2020-08-01 DIAGNOSIS — R109 Unspecified abdominal pain: Secondary | ICD-10-CM

## 2020-08-01 DIAGNOSIS — N200 Calculus of kidney: Secondary | ICD-10-CM | POA: Insufficient documentation

## 2020-08-01 DIAGNOSIS — R079 Chest pain, unspecified: Secondary | ICD-10-CM | POA: Diagnosis not present

## 2020-08-01 DIAGNOSIS — Z87891 Personal history of nicotine dependence: Secondary | ICD-10-CM | POA: Insufficient documentation

## 2020-08-01 DIAGNOSIS — Z79899 Other long term (current) drug therapy: Secondary | ICD-10-CM | POA: Insufficient documentation

## 2020-08-01 DIAGNOSIS — R0602 Shortness of breath: Secondary | ICD-10-CM | POA: Insufficient documentation

## 2020-08-01 DIAGNOSIS — Z20822 Contact with and (suspected) exposure to covid-19: Secondary | ICD-10-CM | POA: Diagnosis not present

## 2020-08-01 LAB — BASIC METABOLIC PANEL
Anion gap: 9 (ref 5–15)
BUN: 20 mg/dL (ref 6–20)
CO2: 24 mmol/L (ref 22–32)
Calcium: 9.5 mg/dL (ref 8.9–10.3)
Chloride: 106 mmol/L (ref 98–111)
Creatinine, Ser: 1.06 mg/dL (ref 0.61–1.24)
GFR, Estimated: >60 mL/min (ref >60)
Glucose, Bld: 127 mg/dL — ABNORMAL HIGH (ref 70–99)
Potassium: 3.5 mmol/L (ref 3.5–5.1)
Sodium: 139 mmol/L (ref 135–145)

## 2020-08-01 LAB — CBC
HCT: 44.9 % (ref 39.0–52.0)
Hemoglobin: 15.2 g/dL (ref 13.0–17.0)
MCH: 30.2 pg (ref 26.0–34.0)
MCHC: 33.9 g/dL (ref 30.0–36.0)
MCV: 89.3 fL (ref 80.0–100.0)
Platelets: 233 10*3/uL (ref 150–400)
RBC: 5.03 MIL/uL (ref 4.22–5.81)
RDW: 12.7 % (ref 11.5–15.5)
WBC: 9.3 10*3/uL (ref 4.0–10.5)
nRBC: 0 % (ref 0.0–0.2)

## 2020-08-01 LAB — URINALYSIS, COMPLETE (UACMP) WITH MICROSCOPIC
Bacteria, UA: NONE SEEN
Bilirubin Urine: NEGATIVE
Glucose, UA: NEGATIVE mg/dL
Hgb urine dipstick: NEGATIVE
Ketones, ur: 5 mg/dL — AB
Leukocytes,Ua: NEGATIVE
Nitrite: NEGATIVE
Protein, ur: NEGATIVE mg/dL
Specific Gravity, Urine: 1.032 — ABNORMAL HIGH (ref 1.005–1.030)
pH: 5 (ref 5.0–8.0)

## 2020-08-01 LAB — HEPATIC FUNCTION PANEL
ALT: 28 U/L (ref 0–44)
AST: 22 U/L (ref 15–41)
Albumin: 4.1 g/dL (ref 3.5–5.0)
Alkaline Phosphatase: 53 U/L (ref 38–126)
Bilirubin, Direct: <0.1 mg/dL (ref 0.0–0.2)
Total Bilirubin: 0.8 mg/dL (ref 0.3–1.2)
Total Protein: 7.2 g/dL (ref 6.5–8.1)

## 2020-08-01 LAB — LIPASE, BLOOD: Lipase: 28 U/L (ref 11–51)

## 2020-08-01 MED ORDER — IOHEXOL 350 MG/ML SOLN
75.0000 mL | Freq: Once | INTRAVENOUS | Status: AC | PRN
  Administered 2020-08-01: 75 mL via INTRAVENOUS
  Filled 2020-08-01: qty 75

## 2020-08-01 MED ORDER — CYCLOBENZAPRINE HCL 5 MG PO TABS: 5.0000 mg | ORAL_TABLET | Freq: Three times a day (TID) | ORAL | 0 refills | Status: AC | PRN

## 2020-08-01 MED ORDER — KETOROLAC TROMETHAMINE 30 MG/ML IJ SOLN
30.0000 mg | Freq: Once | INTRAMUSCULAR | Status: AC
  Administered 2020-08-01: 30 mg via INTRAMUSCULAR
  Filled 2020-08-01: qty 1

## 2020-08-01 MED ORDER — KETOROLAC TROMETHAMINE 10 MG PO TABS: 10.0000 mg | ORAL_TABLET | Freq: Three times a day (TID) | ORAL | 0 refills | Status: AC

## 2020-08-01 MED ORDER — ORPHENADRINE CITRATE 30 MG/ML IJ SOLN
60.0000 mg | INTRAMUSCULAR | Status: AC
  Administered 2020-08-01: 60 mg via INTRAVENOUS
  Filled 2020-08-01: qty 2

## 2020-08-01 NOTE — ED Notes (Signed)
Pt provided with water w/ PA approval.

## 2020-08-01 NOTE — Discharge Instructions (Addendum)
Your exam, labs, and CT images are reassuring at this time.  Is no indication of any blood clot in the lungs.  There is a small 1 mm stone on the left side but is likely not the cause of your overall pain. You should follow-up with your primary provider and he may decide to do a repeat outpatient CT scan in 12 months.  Take the prescription medications as provided, and follow-up with primary provider as discussed peer return to the ED if needed.

## 2020-08-01 NOTE — ED Provider Notes (Signed)
[facility] Emergency Department Provider Note  ____________________________________________   Event Date/Time   First MD Initiated Contact with Patient 08/01/20 1407     (approximate)  I have reviewed the triage vital signs and the nursing notes.   HISTORY  Chief Complaint Flank Pain  HPI Chris Larsen is a 50 y.o. male presents to the ED accompanied by his wife, for evaluation of left-sided flank pain for the last week.  Patient describes the pain intensified severely today, which prompted him to report to the ED.  He denies any trauma, fall, or injury.  He has reported some dark-colored urine but denies any hematuria, urinary retention, urgency or frequency.  Patient is also reporting a 1 week complaint of intermittent left-sided chest pain as well as shortness of breath.  He denies any fevers at home.  He gives a history of kidney stones.     Past Medical History:  Diagnosis Date  . Chronic mental illness    Unable to specifiy  . Hypertension   . Thyroid disease     There are no problems to display for this patient.   History reviewed. No pertinent surgical history.  Prior to Admission medications   Medication Sig Start Date End Date Taking? Authorizing Provider  cyclobenzaprine (FLEXERIL) 5 MG tablet Take 1 tablet (5 mg total) by mouth 3 (three) times daily as needed. 08/01/20  Yes Menshew, Charlesetta Ivory, PA-C  ketorolac (TORADOL) 10 MG tablet Take 1 tablet (10 mg total) by mouth every 8 (eight) hours. 08/01/20  Yes Menshew, Charlesetta Ivory, PA-C  hydrochlorothiazide (HYDRODIURIL) 25 MG tablet Take 1 tablet (25 mg total) by mouth daily. 12/04/17   Joni Reining, PA-C  levothyroxine (SYNTHROID, LEVOTHROID) 175 MCG tablet Take 1 tablet (175 mcg total) by mouth daily. 12/04/17   Joni Reining, PA-C    Allergies Patient has no known allergies.  History reviewed. No pertinent family history.  Social History Social History   Tobacco Use  .  Smoking status: Former Smoker    Quit date: 06/19/2011    Years since quitting: 9.1  . Smokeless tobacco: Never Used  Substance Use Topics  . Alcohol use: No  . Drug use: No    Review of Systems Constitutional: No fever/chills Eyes: No visual changes. ENT: No sore throat. Cardiovascular: Denies chest pain. Respiratory: Denies shortness of breath. Gastrointestinal: No abdominal pain.  No nausea, no vomiting.  No diarrhea.  No constipation.  Left flank pain as above. Genitourinary: Negative for dysuria. Musculoskeletal: Negative for back pain.  Skin: Negative for rash. Neurological: Negative for headaches, focal weakness or numbness. ____________________________________________   PHYSICAL EXAM:  VITAL SIGNS: ED Triage Vitals  Enc Vitals Group     BP 08/01/20 1221 139/86     Pulse Rate 08/01/20 1221 (!) 118     Resp 08/01/20 1221 18     Temp 08/01/20 1221 98.8 F (37.1 C)     Temp Source 08/01/20 1221 Oral     SpO2 08/01/20 1221 98 %     Weight 08/01/20 1222 170 lb (77.1 kg)     Height 08/01/20 1222 5\' 6"  (1.676 m)     Head Circumference --      Peak Flow --      Pain Score 08/01/20 1222 10     Pain Loc --      Pain Edu? --      Excl. in GC? --    Constitutional: Alert and oriented.  Well appearing and in no acute distress. Eyes: Conjunctivae are normal. PERRL. EOMI. Head: Atraumatic. Nose: No congestion/rhinnorhea. Mouth/Throat: Mucous membranes are moist.  Oropharynx non-erythematous. Neck: No stridor.   Cardiovascular: Normal rate, regular rhythm. Grossly normal heart sounds.  Good peripheral circulation. Respiratory: Normal respiratory effort.  No retractions. Lungs CTAB. Gastrointestinal: Soft and nontender. No distention. No abdominal bruits. No CVA tenderness. Genitourinary: deferred Musculoskeletal: No lower extremity tenderness nor edema.  No joint effusions. Neurologic:  Normal speech and language. No gross focal neurologic deficits are appreciated. No gait  instability. Skin:  Skin is warm, dry and intact. No rash noted. Psychiatric: Mood and affect are normal. Speech and behavior are normal. ____________________________________________   LABS (all labs ordered are listed, but only abnormal results are displayed)  Labs Reviewed  URINALYSIS, COMPLETE (UACMP) WITH MICROSCOPIC - Abnormal; Notable for the following components:      Result Value   Color, Urine AMBER (*)    APPearance HAZY (*)    Specific Gravity, Urine 1.032 (*)    Ketones, ur 5 (*)    All other components within normal limits  BASIC METABOLIC PANEL - Abnormal; Notable for the following components:   Glucose, Bld 127 (*)    All other components within normal limits  SARS CORONAVIRUS 2 (TAT 6-24 HRS)  CBC  HEPATIC FUNCTION PANEL  LIPASE, BLOOD  ____________________________________________  RADIOLOGY I, Lissa Hoard, personally viewed and evaluated these images (plain radiographs) as part of my medical decision making, as well as reviewing the written report by the radiologist.  ED MD interpretation:  Agree with radiologist  Official radiology report(s): CT Angio Chest PE W and/or Wo Contrast  Result Date: 08/01/2020 CLINICAL DATA:  Chest pain. Shortness of breath. Rule out pulmonary embolus. EXAM: CT ANGIOGRAPHY CHEST WITH CONTRAST TECHNIQUE: Multidetector CT imaging of the chest was performed using the standard protocol during bolus administration of intravenous contrast. Multiplanar CT image reconstructions and MIPs were obtained to evaluate the vascular anatomy. CONTRAST:  89mL OMNIPAQUE IOHEXOL 350 MG/ML SOLN COMPARISON:  None. FINDINGS: Cardiovascular: Satisfactory opacification of the pulmonary arteries to the segmental level. No evidence of pulmonary embolism. Normal heart size. No pericardial effusion. Mediastinum/Nodes: No enlarged mediastinal, hilar, or axillary lymph nodes. Thyroid gland, trachea, and esophagus demonstrate no significant findings.  Lungs/Pleura: No pleural effusion identified. Mild subsegmental atelectasis identified within the posterior lung bases. No pleural effusion, airspace consolidation or pulmonary edema. No pneumothorax. Upper Abdomen: No acute abnormality. Musculoskeletal: No chest wall abnormality. No acute or significant osseous findings. Review of the MIP images confirms the above findings. IMPRESSION: 1. No evidence for acute pulmonary embolus. 2. Mild subsegmental atelectasis identified within the posterior lung bases. Electronically Signed   By: Signa Kell M.D.   On: 08/01/2020 17:24   CT Renal Stone Study  Addendum Date: 08/01/2020   ADDENDUM REPORT: 08/01/2020 15:49 ADDENDUM: According to the referring provider, the statement regarding lung carcinoma was entered into the patient's history in error. With respect to the 3 mm nodular opacity in the left lower lobe. Recommendation altered to the following based on now correct history with respect to previous carcinoma status: No follow-up needed if patient is low-risk. Non-contrast chest CT can be considered in 12 months if patient is high-risk. This recommendation follows the consensus statement: Guidelines for Management of Incidental Pulmonary Nodules Detected on CT Images: From the Fleischner Society 2017; Radiology 2017; 284:228-243. Electronically Signed   By: Bretta Bang III M.D.   On: 08/01/2020 15:49  Result Date: 08/01/2020 CLINICAL DATA:  Flank pain.  History of lung carcinoma EXAM: CT ABDOMEN AND PELVIS WITHOUT CONTRAST TECHNIQUE: Multidetector CT imaging of the abdomen and pelvis was performed following the standard protocol without oral or IV contrast. COMPARISON:  Chest CT August 25, 2010 no focal FINDINGS: Lower chest: There is atelectatic change in the lung bases. There is a 3 mm nodular opacity in the lateral segment of the left lower lobe seen on axial slice 7 series 4. Hepatobiliary: No focal liver lesions are appreciable on this noncontrast  enhanced study. Gallbladder wall is not appreciably thickened. There is no biliary duct dilatation. Pancreas: There is no pancreatic mass or inflammatory focus. Spleen: No splenic lesions are evident. Adrenals/Urinary Tract: Adrenals bilaterally appear normal. There is a cyst in the medial mid right kidney measuring 3.1 x 2.4 cm. There is no appreciable hydronephrosis on either side. There is a 1 mm calculus in the lower pole of the left kidney. There is no evident ureteral calculus on either side. Urinary bladder is midline with wall thickness within normal limits. Stomach/Bowel: Stomach is mildly distended with fluid and food material. Moderate stool is noted in the colon. There is no appreciable bowel wall or mesenteric thickening. There is no evident bowel obstruction. The terminal ileum appears normal. There is no appreciable free air or portal venous air. Vascular/Lymphatic: There is no abdominal aortic aneurysm. There are foci of aortic atherosclerosis. There is no evident adenopathy in the abdomen or pelvis. Reproductive: Prostate and seminal vesicles are normal in size and contour. There are several prostatic calculi. Other: Periappendiceal region is normal in appearance. No abscess or ascites is evident in the abdomen or pelvis. There is slight fat in the left inguinal ring. Musculoskeletal: There are no blastic or lytic bone lesions. There are foci of degenerative change in the lumbar spine. There are no blastic or lytic bone lesions. There are no intramuscular lesions. IMPRESSION: 1. 1 mm nonobstructing calculus lower pole left kidney. No hydronephrosis or ureteral calculus on either side. Urinary bladder wall thickness within normal limits. 2. No bowel wall thickening or bowel obstruction. No abscess in the abdomen or pelvis. No periappendiceal region inflammation. 3. 3 mm nodular opacity left lower lobe lateral segment. Given reported history of lung carcinoma, this finding may warrant follow-up chest  CT in approximately 3 months to assess for stability. Electronically Signed: By: Bretta Bang III M.D. On: 08/01/2020 13:42    ____________________________________________   PROCEDURES  Procedure(s) performed (including Critical Care):  Procedures  Toradol 30 mg IM Norflex 60 mg IVP  ____________________________________________   INITIAL IMPRESSION / ASSESSMENT AND PLAN / ED COURSE  As part of my medical decision making, I reviewed the following data within the electronic MEDICAL RECORD NUMBER History obtained from family, Labs reviewed WNL, Discussed with radiologist: no history of lung CA. Notes from prior ED visits and [place] Controlled Substance Database  ----------------------------------------- 3:54 PM on 08/01/2020 ----------------------------------------- S/w Dr. Margarita Grizzle (RAD): given update that patient has no history of Lung CA.    Differential diagnosis includes, but is not limited to, acute appendicitis, renal colic, testicular torsion, urinary tract infection/pyelonephritis, prostatitis,  epididymitis, diverticulitis, small bowel obstruction or ileus, colitis, abdominal aortic aneurysm, gastroenteritis, hernia, pneumonia, reactive airway disease including asthma, CHF including exacerbation with or without pulmonary/interstitial edema, pneumothorax, ACS, thoracic trauma, and pulmonary embolism.  Patient with ED evaluation of acute left-sided flank pain.  Patient presented with a 1 week complaint of intermittent left leg pain, chest pain,  shortness of breath.  He was evaluated initially with labs, including urine and a CT renal stone study.  He was found to only have a 1 mm stone on the left without hydronephrosis.  He did have an incidental finding of a 3 mm left lower lobe nodule, and a benign right renal cyst.  Patient had pain out of proportion to his clinical findings, long with tachycardia, and reports of pleuritic chest pain.  He was subsequently evaluated with a CT angio  study to rule out PE.  Study was reassuring as it showed no acute PE or other intrathoracic findings.  Patient is wife are reassured overall by his findings.  Symptoms likely represent a musculoskeletal etiology as opposed to a renal colic or intrathoracic process.  Patient reports improvement of his symptoms at the time of this disposition.  He will be discharged to follow-up with his primary provider for ongoing symptoms.  Return precautions have been discussed.  Prescription for Toradol and Flexeril are provided for his benefit.  Work is also provided as suggested.  Clinical Course as of 08/01/20 1846  Wed Aug 01, 2020  1549 WBC, UA: 0-5 [JM]  1735 CT Renal Soundra PilonStone Study [MF]    Clinical Course User Index [JM] Menshew, Charlesetta IvoryJenise V Bacon, PA-C [MF] Concha SeFunke, Mary E, MD     ____________________________________________   FINAL CLINICAL IMPRESSION(S) / ED DIAGNOSES  Final diagnoses:  Left flank pain  Nephrolithiasis     ED Discharge Orders         Ordered    ketorolac (TORADOL) 10 MG tablet  Every 8 hours       Note to Pharmacy: IV/IM DOSE GIVEN IN THE ED   08/01/20 1759    cyclobenzaprine (FLEXERIL) 5 MG tablet  3 times daily PRN        08/01/20 1759          *Please note:  [place]had E Clayton was evaluated in Emergency Department on 08/01/2020 for the symptoms described in the history of present illness. He was evaluated in the context of the global COVID-19 pandemic, which necessitated consideration that the patient might be at risk for infection with the SARS-CoV-2 virus that causes COVID-19. Institutional protocols and algorithms that pertain to the evaluation of patients at risk for COVID-19 are in a state of rapid change based on information released by regulatory bodies including the CDC and federal and state organizations. These policies and algorithms were followed during the patient's care in the ED.  Some ED evaluations and interventions may be delayed as a result of limited staffing  during and the pandemic.*   Note:  This document was prepared using Dragon voice recognition software and may include unintentional dictation errors.    Lissa HoardMenshew, Jenise V Bacon, PA-C 08/01/20 1846    Concha SeFunke, Mary E, MD 08/01/20 [phone]

## 2020-08-01 NOTE — ED Triage Notes (Signed)
Pt to ER via POV with complaints of right sided flank pain x1 week that intensified today. Pt reports dark colored urine but denies any other urinary symptoms. Pain radiates down into R side of groin and R sided lower back. No fevers at home. History of multiple kidney stones in the past.

## 2020-08-02 LAB — SARS CORONAVIRUS 2 (TAT 6-24 HRS): SARS Coronavirus 2: NEGATIVE
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
## 2021-10-21 IMAGING — CT CT ANGIO CHEST
2 of 6 series · 19 of 46 positions shown · IV contrast (APPLIED)
Comparison: None.

CLINICAL DATA: Chest pain. Shortness of breath. Rule out pulmonary
embolus.

EXAM:
CT ANGIOGRAPHY CHEST WITH CONTRAST
TECHNIQUE: Multidetector CT imaging of the chest was performed using the
standard protocol during bolus administration of intravenous
contrast. Multiplanar CT image reconstructions and MIPs were
obtained to evaluate the vascular anatomy.
CONTRAST:  75mL OMNIPAQUE IOHEXOL 350 MG/ML SOLN

[Series 5: thins · axial · 0.75mm/px · z∈[-614,-341]mm · 17 of 301 slices shown]
[im 14/301  lung]
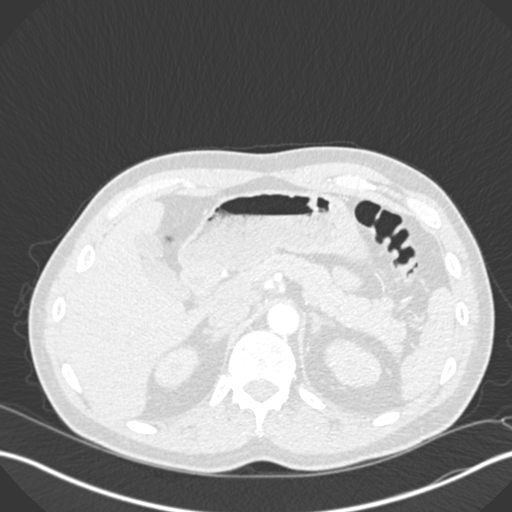
[im 27/301  soft-tissue]
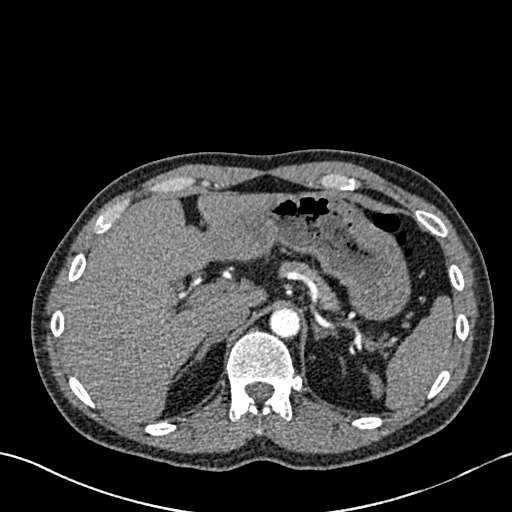
[im 53/301  lung]
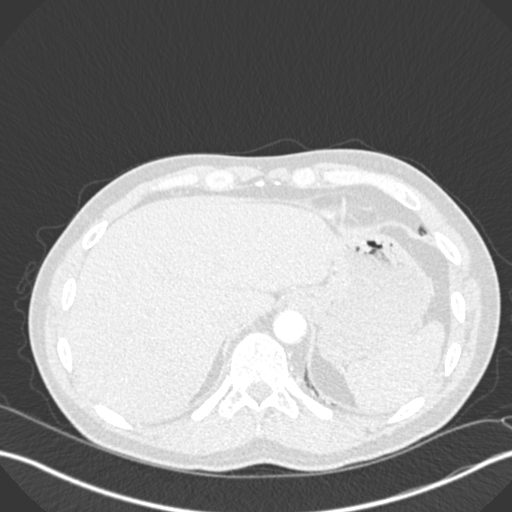
[im 66/301  soft-tissue]
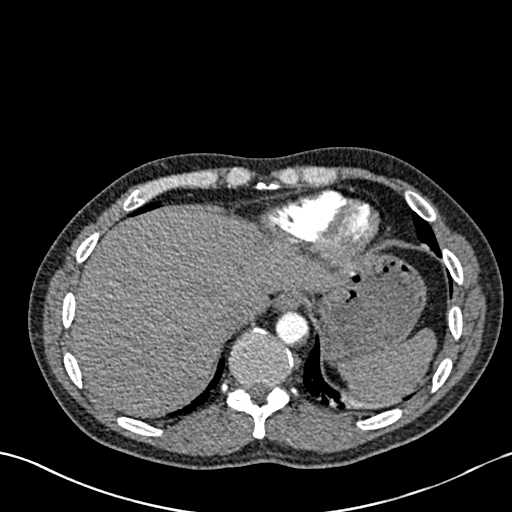
[im 79/301  lung]
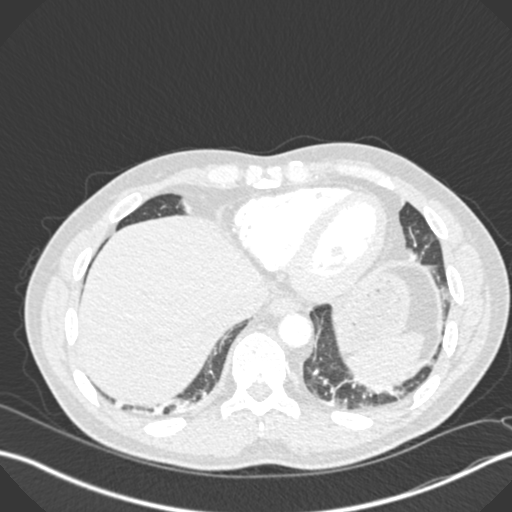
[im 105/301  soft-tissue]
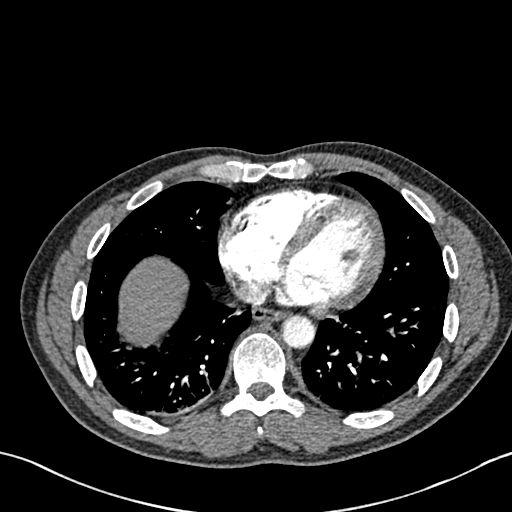
[im 118/301  lung]
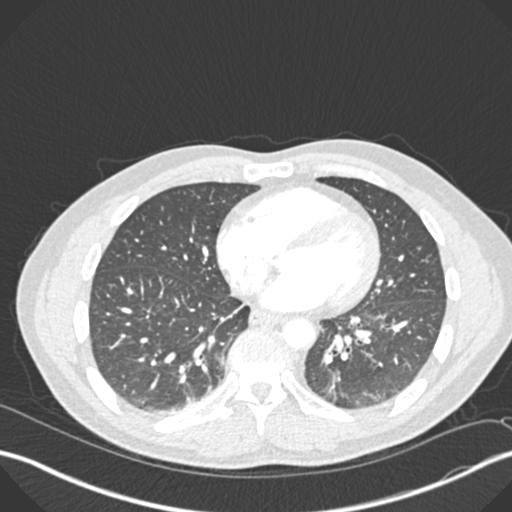
[im 131/301  soft-tissue]
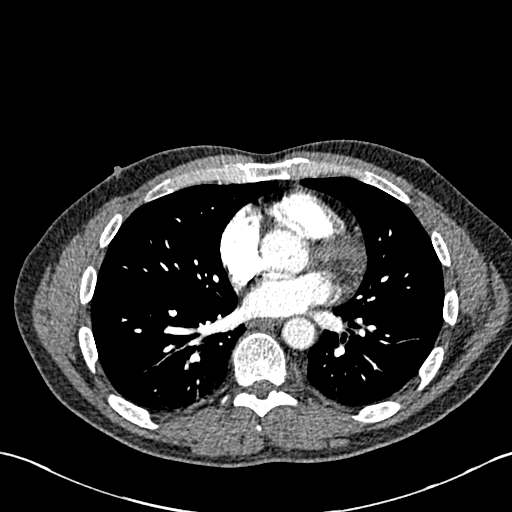
[im 157/301  lung]
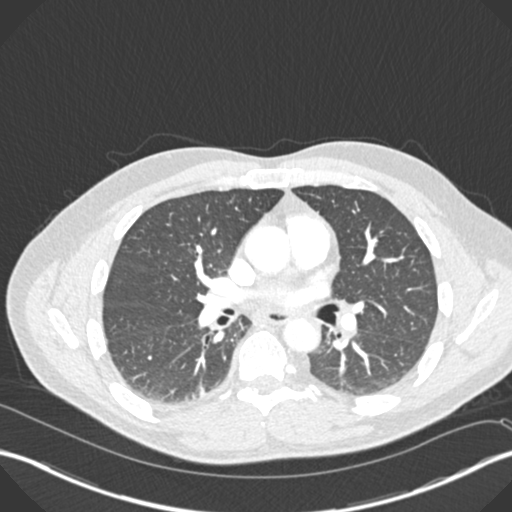
[im 170/301  soft-tissue]
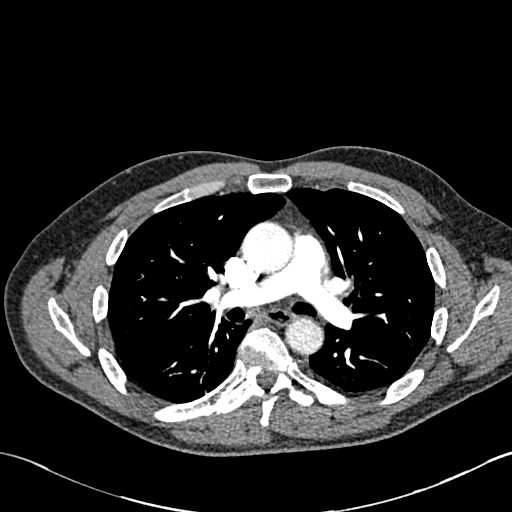
[im 183/301  lung]
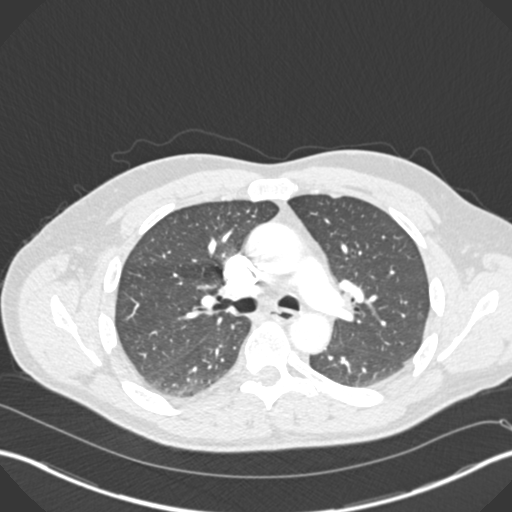
[im 196/301  soft-tissue]
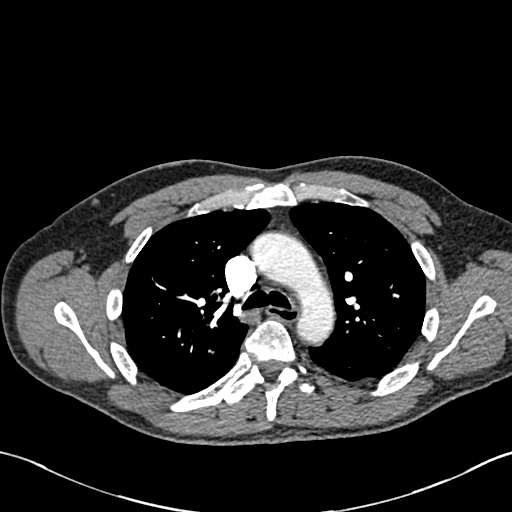
[im 222/301  lung]
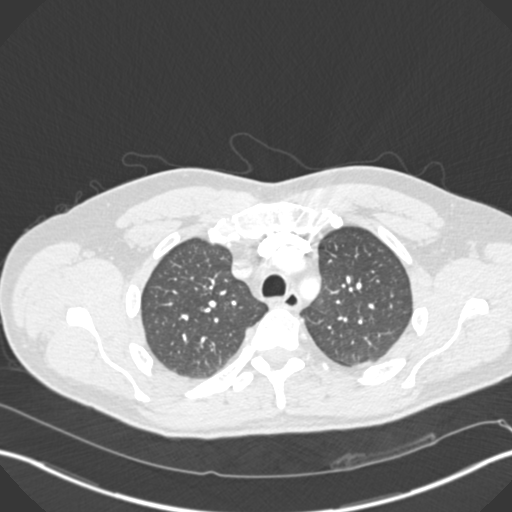
[im 235/301  soft-tissue]
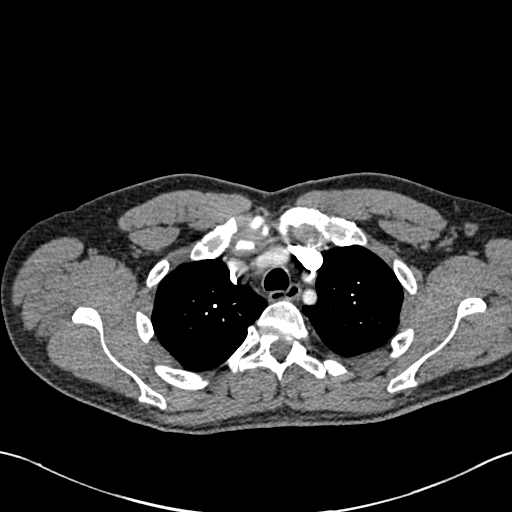
[im 248/301  lung]
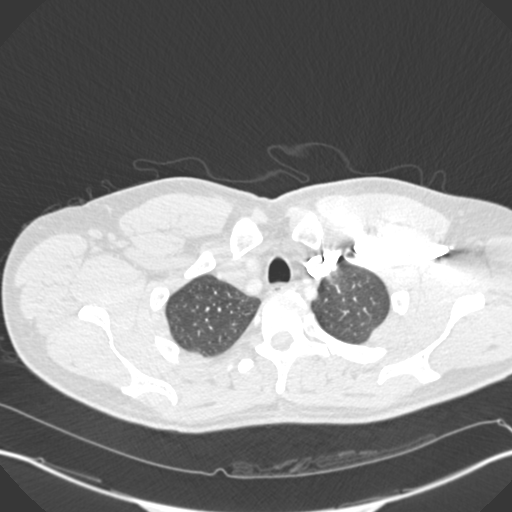
[im 274/301  soft-tissue]
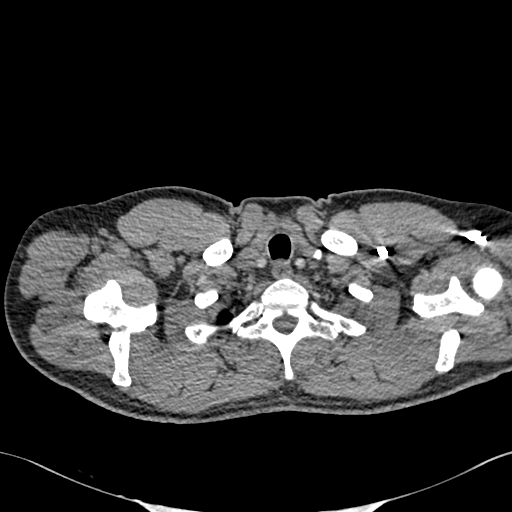
[im 287/301  lung]
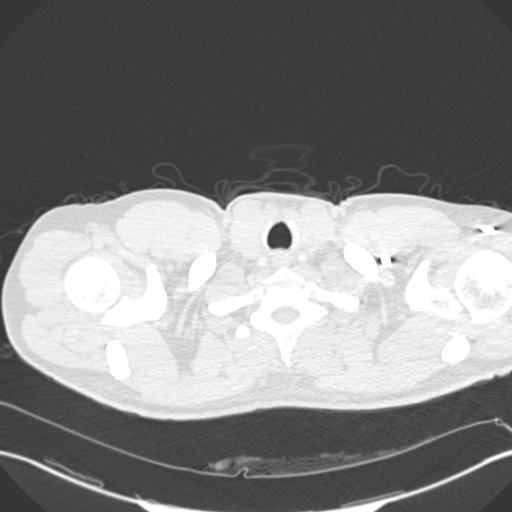

[Series 7: coronal mpr · coronal · 0.63mm/px · 2 of 83 slices shown]
[im 28/83  soft-tissue]
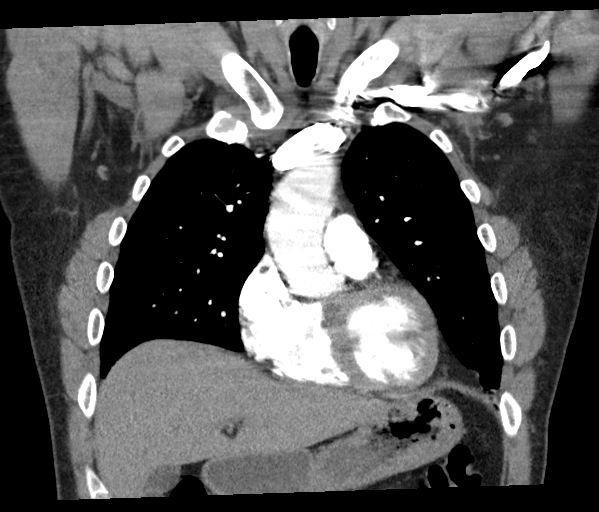
[im 55/83  soft-tissue]
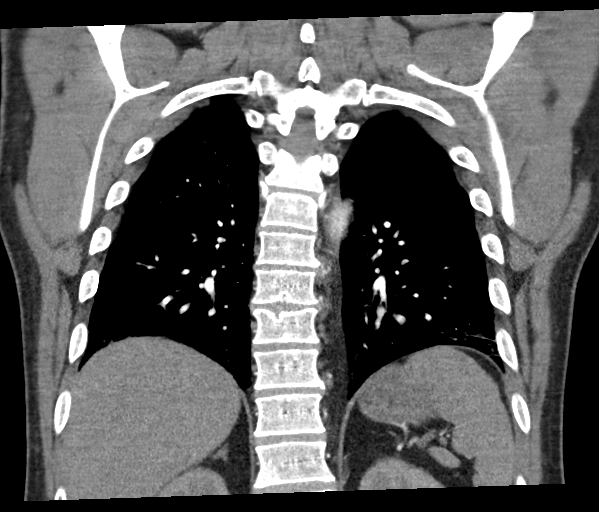

[19 of 46 positions shown; findings below may reference images not displayed]

FINDINGS: Cardiovascular: Satisfactory opacification of the pulmonary arteries
to the segmental level. No evidence of pulmonary embolism. Normal
heart size. No pericardial effusion.

Mediastinum/Nodes: No enlarged mediastinal, hilar, or axillary lymph
nodes. Thyroid gland, trachea, and esophagus demonstrate no
significant findings.

Lungs/Pleura: No pleural effusion identified. Mild subsegmental
atelectasis identified within the posterior lung bases. No pleural
effusion, airspace consolidation or pulmonary edema. No
pneumothorax.

Upper Abdomen: No acute abnormality.

Musculoskeletal: No chest wall abnormality. No acute or significant
osseous findings.

Review of the MIP images confirms the above findings.
IMPRESSION: 1. No evidence for acute pulmonary embolus.
2. Mild subsegmental atelectasis identified within the posterior
lung bases.

## 2021-10-21 IMAGING — CT CT RENAL STONE PROTOCOL
2 of 4 series · 13 of 46 positions shown, 15 images · non-contrast
Comparison: Chest CT August 25, 2010 no focal
COMPARISON: Chest CT August 25, 2010 no focal

Addendum:
CLINICAL DATA: Flank pain.  History of lung carcinoma

EXAM:
CT ABDOMEN AND PELVIS WITHOUT CONTRAST
TECHNIQUE: Multidetector CT imaging of the abdomen and pelvis was performed
following the standard protocol without oral or IV contrast.

[Series 2: stone full standard · axial · 0.75mm/px · z∈[-514,-114]mm · 10 of 98 slices shown, 12 images]
[im 9/98  soft-tissue]
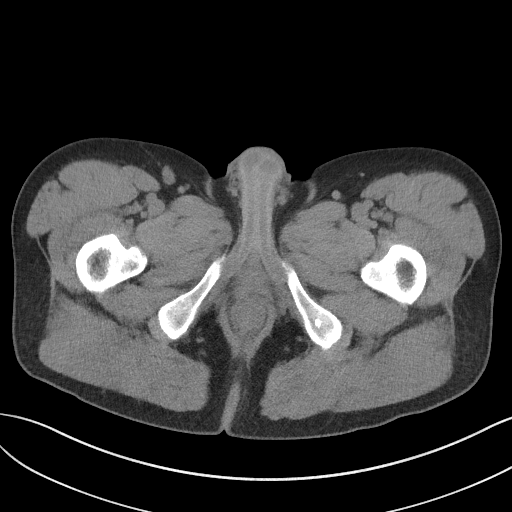
[im 9/98  bone]
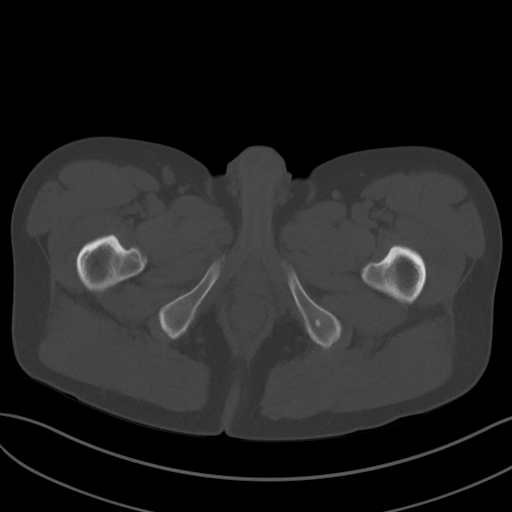
[im 17/98  soft-tissue]
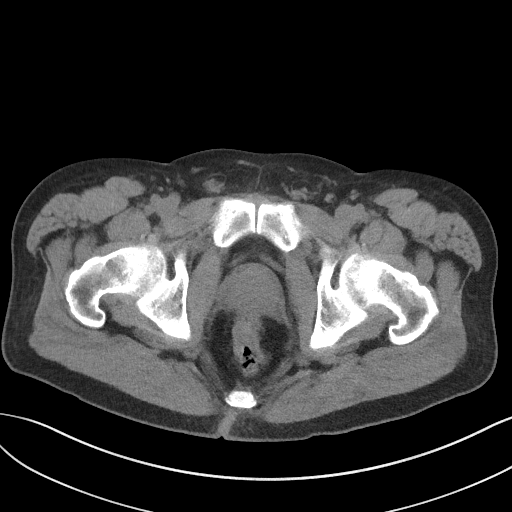
[im 25/98  soft-tissue]
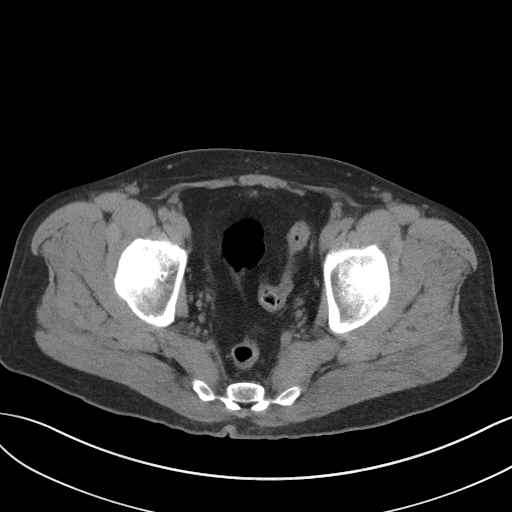
[im 37/98  soft-tissue]
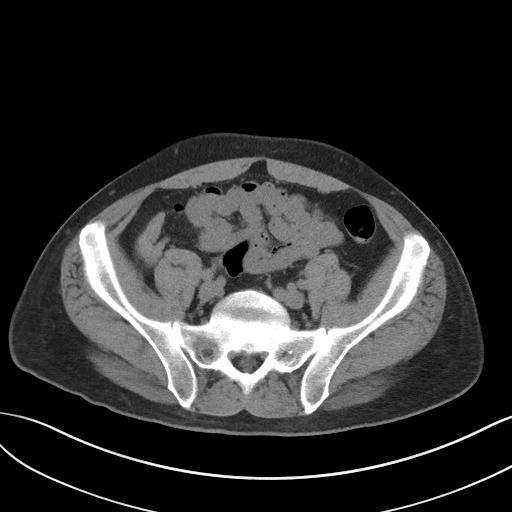
[im 45/98  soft-tissue]
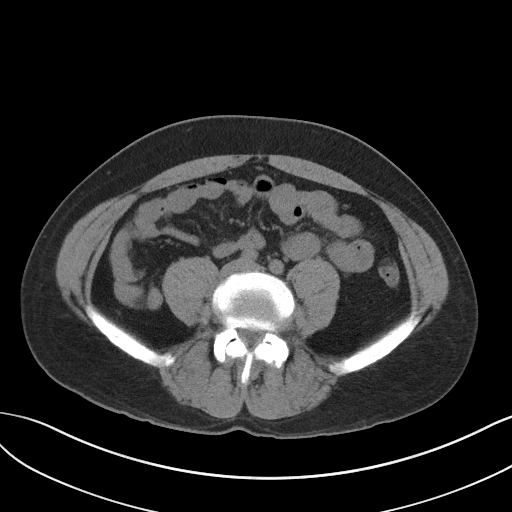
[im 53/98  soft-tissue]
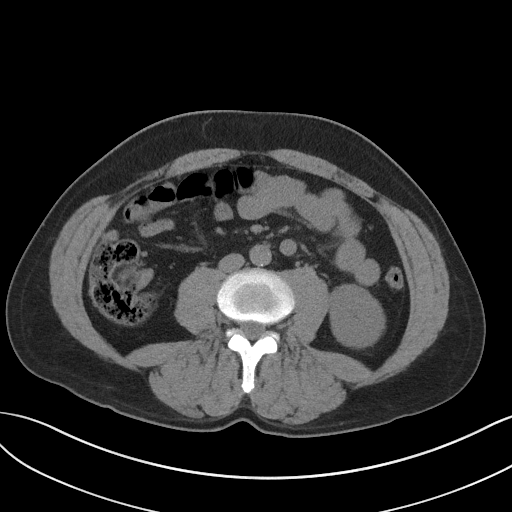
[im 61/98  soft-tissue]
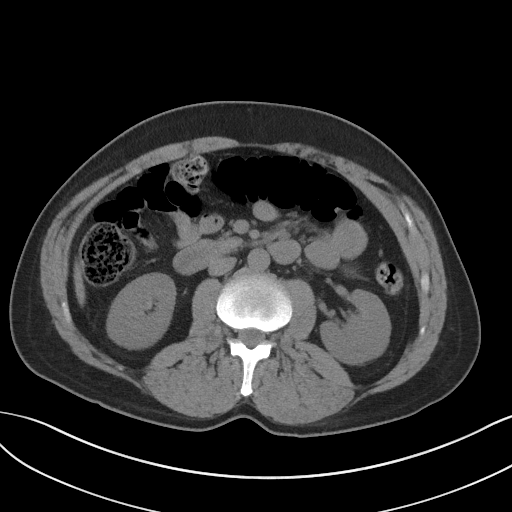
[im 73/98  soft-tissue]
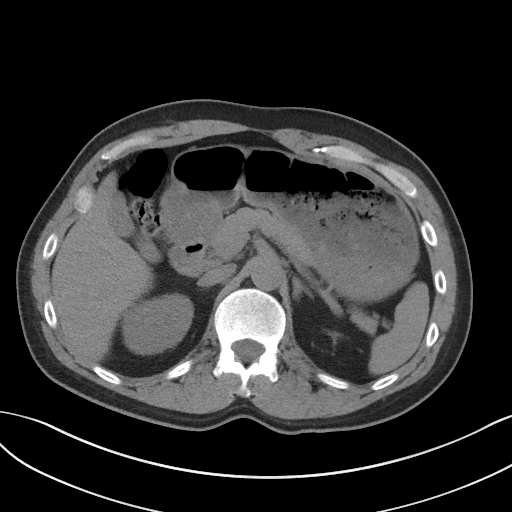
[im 81/98  soft-tissue]
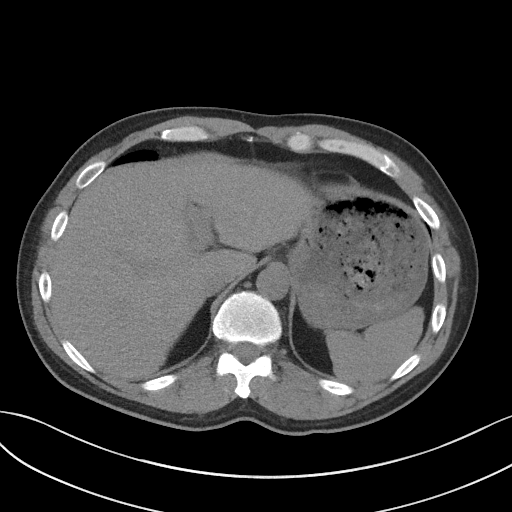
[im 81/98  bone]
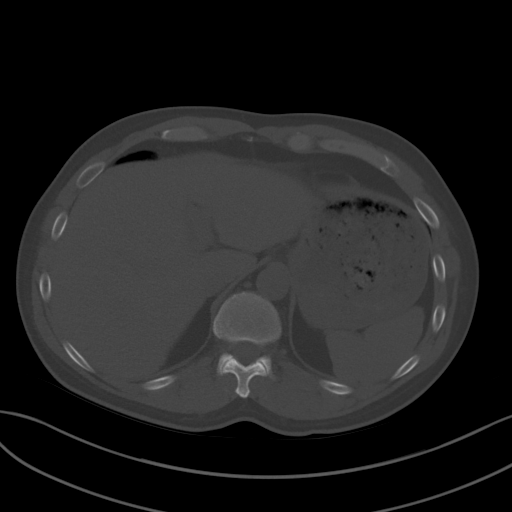
[im 89/98  soft-tissue]
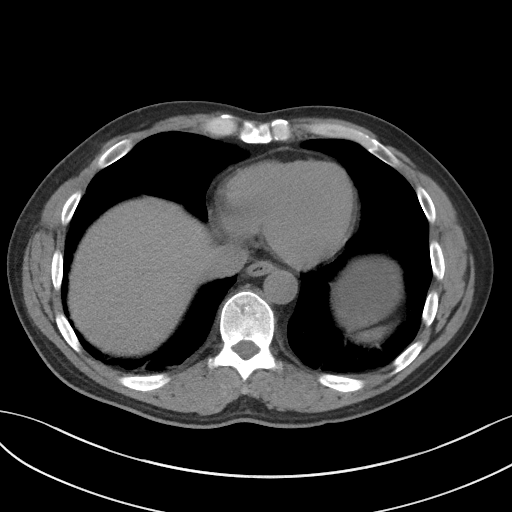

[Series 5: coronal · coronal · 0.78mm/px · 3 of 125 slices shown]
[im 42/125  soft-tissue]
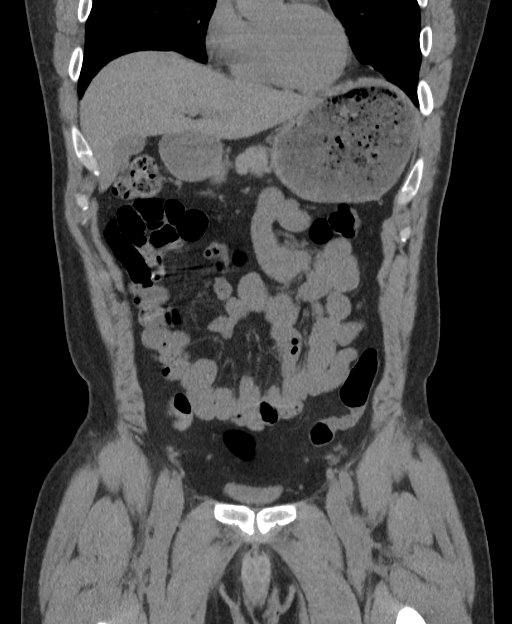
[im 56/125  soft-tissue]
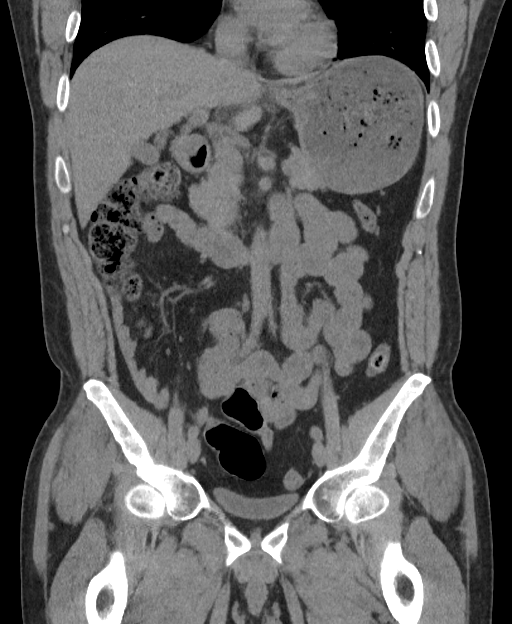
[im 69/125  soft-tissue]
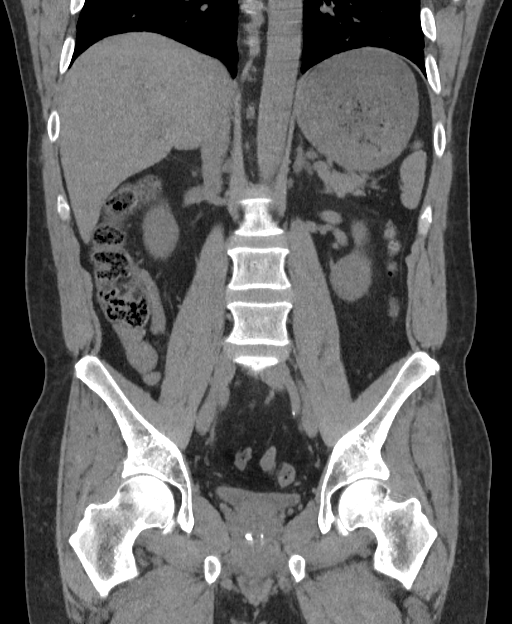

[13 of 46 positions shown; findings below may reference images not displayed]

FINDINGS: Lower chest: There is atelectatic change in the lung bases. There is
a 3 mm nodular opacity in the lateral segment of the left lower lobe
seen on axial slice 7 series 4.

Hepatobiliary: No focal liver lesions are appreciable on this
noncontrast enhanced study. Gallbladder wall is not appreciably
thickened. There is no biliary duct dilatation.

Pancreas: There is no pancreatic mass or inflammatory focus.

Spleen: No splenic lesions are evident.

Adrenals/Urinary Tract: Adrenals bilaterally appear normal. There is
a cyst in the medial mid right kidney measuring 3.1 x 2.4 cm. There
is no appreciable hydronephrosis on either side. There is a 1 mm
calculus in the lower pole of the left kidney. There is no evident
ureteral calculus on either side. Urinary bladder is midline with
wall thickness within normal limits.

Stomach/Bowel: Stomach is mildly distended with fluid and food
material. Moderate stool is noted in the colon. There is no
appreciable bowel wall or mesenteric thickening. There is no evident
bowel obstruction. The terminal ileum appears normal. There is no
appreciable free air or portal venous air.

Vascular/Lymphatic: There is no abdominal aortic aneurysm. There are
foci of aortic atherosclerosis. There is no evident adenopathy in
the abdomen or pelvis.

Reproductive: Prostate and seminal vesicles are normal in size and
contour. There are several prostatic calculi.

Other: Periappendiceal region is normal in appearance. No abscess or
ascites is evident in the abdomen or pelvis. There is slight fat in
the left inguinal ring.

Musculoskeletal: There are no blastic or lytic bone lesions. There
are foci of degenerative change in the lumbar spine. There are no
blastic or lytic bone lesions. There are no intramuscular lesions.
IMPRESSION: 1. 1 mm nonobstructing calculus lower pole left kidney. No
hydronephrosis or ureteral calculus on either side. Urinary bladder
wall thickness within normal limits.

2. No bowel wall thickening or bowel obstruction. No abscess in the
abdomen or pelvis. No periappendiceal region inflammation.

3. 3 mm nodular opacity left lower lobe lateral segment. Given
reported history of lung carcinoma, this finding may warrant
follow-up chest CT in approximately 3 months to assess for
stability.

ADDENDUM:
According to the referring provider, the statement regarding lung
carcinoma was entered into the patient's history in error. With
respect to the 3 mm nodular opacity in the left lower lobe.
Recommendation altered to the following based on now correct history
with respect to previous carcinoma status: No follow-up needed if
patient is low-risk. Non-contrast chest CT can be considered in 12
months if patient is high-risk. This recommendation follows the
consensus statement: Guidelines for Management of Incidental
Pulmonary Nodules Detected on CT Images: From the [HOSPITAL]

*** End of Addendum ***
FINDINGS: Lower chest: There is atelectatic change in the lung bases. There is
a 3 mm nodular opacity in the lateral segment of the left lower lobe
seen on axial slice 7 series 4.

Hepatobiliary: No focal liver lesions are appreciable on this
noncontrast enhanced study. Gallbladder wall is not appreciably
thickened. There is no biliary duct dilatation.

Pancreas: There is no pancreatic mass or inflammatory focus.

Spleen: No splenic lesions are evident.

Adrenals/Urinary Tract: Adrenals bilaterally appear normal. There is
a cyst in the medial mid right kidney measuring 3.1 x 2.4 cm. There
is no appreciable hydronephrosis on either side. There is a 1 mm
calculus in the lower pole of the left kidney. There is no evident
ureteral calculus on either side. Urinary bladder is midline with
wall thickness within normal limits.

Stomach/Bowel: Stomach is mildly distended with fluid and food
material. Moderate stool is noted in the colon. There is no
appreciable bowel wall or mesenteric thickening. There is no evident
bowel obstruction. The terminal ileum appears normal. There is no
appreciable free air or portal venous air.

Vascular/Lymphatic: There is no abdominal aortic aneurysm. There are
foci of aortic atherosclerosis. There is no evident adenopathy in
the abdomen or pelvis.

Reproductive: Prostate and seminal vesicles are normal in size and
contour. There are several prostatic calculi.

Other: Periappendiceal region is normal in appearance. No abscess or
ascites is evident in the abdomen or pelvis. There is slight fat in
the left inguinal ring.

Musculoskeletal: There are no blastic or lytic bone lesions. There
are foci of degenerative change in the lumbar spine. There are no
blastic or lytic bone lesions. There are no intramuscular lesions.
IMPRESSION: 1. 1 mm nonobstructing calculus lower pole left kidney. No
hydronephrosis or ureteral calculus on either side. Urinary bladder
wall thickness within normal limits.

2. No bowel wall thickening or bowel obstruction. No abscess in the
abdomen or pelvis. No periappendiceal region inflammation.

3. 3 mm nodular opacity left lower lobe lateral segment. Given
reported history of lung carcinoma, this finding may warrant
follow-up chest CT in approximately 3 months to assess for
stability.
